# Patient Record
Sex: Male | Born: 1990 | Race: White | Hispanic: Yes | Marital: Single | Smoking: Former smoker
Health system: Southern US, Community
[De-identification: ages and names within clinical notes are randomized; demographics above are authoritative.]

## PROBLEM LIST (undated history)

## (undated) DIAGNOSIS — N451 Epididymitis: Secondary | ICD-10-CM

## (undated) DIAGNOSIS — F3181 Bipolar II disorder: Secondary | ICD-10-CM

## (undated) DIAGNOSIS — J45909 Unspecified asthma, uncomplicated: Secondary | ICD-10-CM

---
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
## 2013-10-30 DIAGNOSIS — N451 Epididymitis: Secondary | ICD-10-CM

## 2013-10-30 HISTORY — DX: Epididymitis: N45.1

## 2014-01-09 ENCOUNTER — Inpatient Hospital Stay (HOSPITAL_COMMUNITY)
Admission: EM | Admit: 2014-01-09 | Discharge: 2014-01-11 | DRG: 689 | Disposition: A | Discharge status: Home / Self Care | Payer: 59 | Attending: Internal Medicine | Admitting: Internal Medicine

## 2014-01-09 ENCOUNTER — Emergency Department (HOSPITAL_COMMUNITY): Payer: 59

## 2014-01-09 ENCOUNTER — Encounter (HOSPITAL_COMMUNITY): Payer: Self-pay | Admitting: Emergency Medicine

## 2014-01-09 DIAGNOSIS — N390 Urinary tract infection, site not specified: Principal | ICD-10-CM

## 2014-01-09 DIAGNOSIS — D72829 Elevated white blood cell count, unspecified: Secondary | ICD-10-CM | POA: Diagnosis present

## 2014-01-09 DIAGNOSIS — J45909 Unspecified asthma, uncomplicated: Secondary | ICD-10-CM | POA: Diagnosis present

## 2014-01-09 DIAGNOSIS — N451 Epididymitis: Secondary | ICD-10-CM | POA: Diagnosis present

## 2014-01-09 DIAGNOSIS — J40 Bronchitis, not specified as acute or chronic: Secondary | ICD-10-CM

## 2014-01-09 DIAGNOSIS — R059 Cough, unspecified: Secondary | ICD-10-CM | POA: Diagnosis present

## 2014-01-09 DIAGNOSIS — F329 Major depressive disorder, single episode, unspecified: Secondary | ICD-10-CM | POA: Diagnosis present

## 2014-01-09 DIAGNOSIS — F3289 Other specified depressive episodes: Secondary | ICD-10-CM | POA: Diagnosis present

## 2014-01-09 DIAGNOSIS — R05 Cough: Secondary | ICD-10-CM | POA: Diagnosis present

## 2014-01-09 DIAGNOSIS — F1210 Cannabis abuse, uncomplicated: Secondary | ICD-10-CM | POA: Diagnosis present

## 2014-01-09 DIAGNOSIS — R109 Unspecified abdominal pain: Secondary | ICD-10-CM

## 2014-01-09 DIAGNOSIS — Z79899 Other long term (current) drug therapy: Secondary | ICD-10-CM

## 2014-01-09 DIAGNOSIS — F411 Generalized anxiety disorder: Secondary | ICD-10-CM | POA: Diagnosis present

## 2014-01-09 DIAGNOSIS — F17200 Nicotine dependence, unspecified, uncomplicated: Secondary | ICD-10-CM | POA: Diagnosis present

## 2014-01-09 DIAGNOSIS — A419 Sepsis, unspecified organism: Secondary | ICD-10-CM

## 2014-01-09 HISTORY — DX: Epididymitis: N45.1

## 2014-01-09 HISTORY — DX: Unspecified asthma, uncomplicated: J45.909

## 2014-01-09 LAB — CBC WITH DIFFERENTIAL/PLATELET
Basophils Absolute: 0 10*3/uL (ref 0.0–0.1)
Basophils Relative: 0 % (ref 0–1)
EOS ABS: 0 10*3/uL (ref 0.0–0.7)
EOS PCT: 0 % (ref 0–5)
HCT: 42 % (ref 39.0–52.0)
Hemoglobin: 14.9 g/dL (ref 13.0–17.0)
Lymphocytes Relative: 9 % — ABNORMAL LOW (ref 12–46)
Lymphs Abs: 1.8 10*3/uL (ref 0.7–4.0)
MCH: 29.9 pg (ref 26.0–34.0)
MCHC: 35.5 g/dL (ref 30.0–36.0)
MCV: 84.2 fL (ref 78.0–100.0)
MONO ABS: 2.3 10*3/uL — AB (ref 0.1–1.0)
Monocytes Relative: 11 % (ref 3–12)
NEUTROS ABS: 16.6 10*3/uL — AB (ref 1.7–7.7)
NEUTROS PCT: 80 % — AB (ref 43–77)
PLATELETS: 266 10*3/uL (ref 150–400)
RBC: 4.99 MIL/uL (ref 4.22–5.81)
RDW: 13.4 % (ref 11.5–15.5)
WBC: 20.8 10*3/uL — AB (ref 4.0–10.5)

## 2014-01-09 LAB — BASIC METABOLIC PANEL
BUN: 7 mg/dL (ref 6–23)
CO2: 24 meq/L (ref 19–32)
Calcium: 9.5 mg/dL (ref 8.4–10.5)
Chloride: 98 mEq/L (ref 96–112)
Creatinine, Ser: 0.72 mg/dL (ref 0.50–1.35)
GFR calc Af Amer: >90 mL/min (ref >90)
GLUCOSE: 92 mg/dL (ref 70–99)
Potassium: 4 mEq/L (ref 3.7–5.3)
Sodium: 137 mEq/L (ref 137–147)

## 2014-01-09 LAB — URINALYSIS, ROUTINE W REFLEX MICROSCOPIC
BILIRUBIN URINE: NEGATIVE
Glucose, UA: NEGATIVE mg/dL
HGB URINE DIPSTICK: NEGATIVE
KETONES UR: NEGATIVE mg/dL
NITRITE: POSITIVE — AB
Protein, ur: NEGATIVE mg/dL
Specific Gravity, Urine: 1.011 (ref 1.005–1.030)
Urobilinogen, UA: 0.2 mg/dL (ref 0.0–1.0)
pH: 8.5 — ABNORMAL HIGH (ref 5.0–8.0)

## 2014-01-09 LAB — URINE MICROSCOPIC-ADD ON

## 2014-01-09 LAB — CG4 I-STAT (LACTIC ACID): Lactic Acid, Venous: 0.69 mmol/L (ref 0.5–2.2)

## 2014-01-09 MED ORDER — ONDANSETRON HCL 4 MG/2ML IJ SOLN
4.0000 mg | Freq: Once | INTRAMUSCULAR | Status: AC
  Administered 2014-01-09: 4 mg via INTRAVENOUS
  Filled 2014-01-09: qty 2

## 2014-01-09 MED ORDER — IBUPROFEN 600 MG PO TABS
600.0000 mg | ORAL_TABLET | Freq: Four times a day (QID) | ORAL | Status: DC | PRN
  Filled 2014-01-09: qty 1

## 2014-01-09 MED ORDER — ACETAMINOPHEN 650 MG RE SUPP: 650.0000 mg | Freq: Four times a day (QID) | RECTAL | Status: DC | PRN

## 2014-01-09 MED ORDER — GUAIFENESIN 100 MG/5ML PO SOLN: 10.0000 mL | ORAL | Status: DC | PRN

## 2014-01-09 MED ORDER — ONDANSETRON HCL 4 MG PO TABS: 4.0000 mg | ORAL_TABLET | Freq: Four times a day (QID) | ORAL | Status: DC | PRN

## 2014-01-09 MED ORDER — SODIUM CHLORIDE 0.9 % IJ SOLN
3.0000 mL | Freq: Two times a day (BID) | INTRAMUSCULAR | Status: DC
  Administered 2014-01-11: 3 mL via INTRAVENOUS

## 2014-01-09 MED ORDER — ONDANSETRON HCL 4 MG/2ML IJ SOLN
4.0000 mg | Freq: Four times a day (QID) | INTRAMUSCULAR | Status: DC | PRN
  Administered 2014-01-11: 4 mg via INTRAVENOUS
  Filled 2014-01-09: qty 2

## 2014-01-09 MED ORDER — HYDROMORPHONE HCL PF 1 MG/ML IJ SOLN: 1.0000 mg | INTRAMUSCULAR | Status: AC | PRN

## 2014-01-09 MED ORDER — SODIUM CHLORIDE 0.9 % IV SOLN: INTRAVENOUS | Status: DC

## 2014-01-09 MED ORDER — ENOXAPARIN SODIUM 40 MG/0.4ML ~~LOC~~ SOLN
40.0000 mg | Freq: Every day | SUBCUTANEOUS | Status: DC
  Administered 2014-01-09 – 2014-01-10 (×2): 40 mg via SUBCUTANEOUS
  Filled 2014-01-09 (×3): qty 0.4

## 2014-01-09 MED ORDER — SODIUM CHLORIDE 0.9 % IV BOLUS (SEPSIS)
1000.0000 mL | Freq: Once | INTRAVENOUS | Status: AC
  Administered 2014-01-09: 1000 mL via INTRAVENOUS

## 2014-01-09 MED ORDER — ACETAMINOPHEN 325 MG PO TABS
650.0000 mg | ORAL_TABLET | Freq: Four times a day (QID) | ORAL | Status: DC | PRN
  Administered 2014-01-11: 650 mg via ORAL
  Filled 2014-01-09: qty 2

## 2014-01-09 MED ORDER — DEXTROSE 5 % IV SOLN
500.0000 mg | Freq: Every day | INTRAVENOUS | Status: DC
  Administered 2014-01-09 – 2014-01-10 (×2): 500 mg via INTRAVENOUS
  Filled 2014-01-09 (×3): qty 500

## 2014-01-09 MED ORDER — IOHEXOL 300 MG/ML  SOLN
100.0000 mL | Freq: Once | INTRAMUSCULAR | Status: AC | PRN
  Administered 2014-01-09: 100 mL via INTRAVENOUS

## 2014-01-09 MED ORDER — DEXTROSE 5 % IV SOLN
1.0000 g | Freq: Once | INTRAVENOUS | Status: AC
  Administered 2014-01-09: 1 g via INTRAVENOUS
  Filled 2014-01-09: qty 10

## 2014-01-09 MED ORDER — ONDANSETRON HCL 4 MG/2ML IJ SOLN: 4.0000 mg | Freq: Three times a day (TID) | INTRAMUSCULAR | Status: AC | PRN

## 2014-01-09 MED ORDER — DEXTROSE 5 % IV SOLN
1.0000 g | INTRAVENOUS | Status: DC
  Administered 2014-01-10: 1 g via INTRAVENOUS
  Filled 2014-01-09 (×2): qty 10

## 2014-01-09 MED ORDER — SODIUM CHLORIDE 0.9 % IV SOLN
INTRAVENOUS | Status: DC
  Administered 2014-01-09: via INTRAVENOUS

## 2014-01-09 MED ORDER — KETOROLAC TROMETHAMINE 30 MG/ML IJ SOLN
30.0000 mg | Freq: Once | INTRAMUSCULAR | Status: AC
  Administered 2014-01-09: 30 mg via INTRAVENOUS
  Filled 2014-01-09: qty 1

## 2014-01-09 NOTE — ED Notes (Signed)
Pt alert and oriented x4. Respirations even and unlabored, bilateral symmetrical rise and fall of chest. Skin warm and dry. In no acute distress. Denies needs.   

## 2014-01-09 NOTE — ED Notes (Addendum)
Pt c/o increasing cold symptoms and rib cage pain x "over one week."  Pain score 5/10.  Pt sts "I had a really crappy night, last night, I kept coughing and I was SOB."  Hx of childhood asthma and anxiety.

## 2014-01-09 NOTE — Progress Notes (Signed)
   CARE MANAGEMENT ED NOTE 01/09/2014  Patient:  Moreen FowlerKAPLAN,Lelynd T   Account Number:  [id number]  Date Initiated:  01/09/2014  Documentation initiated by:  Edd ArbourGIBBS,Dimples Probus  Subjective/Objective Assessment:   23 yr old [employer] male [facility]     Subjective/Objective Assessment Detail:     Action/Plan:   Cm spoke with pt updated EPIC   Action/Plan Detail:   Anticipated DC Date:  01/09/2014     Status Recommendation to Physician:   Result of Recommendation:    Other ED Services  Consult Working Plan    DC Planning Services  Other  PCP issues    Choice offered to / List presented to:            Status of service:  Completed, signed off  ED Comments:   ED Comments Detail:

## 2014-01-09 NOTE — ED Provider Notes (Signed)
TIME SEEN: 5:12 PM  CHIEF COMPLAINT: Cough, body aches, fever  HPI: Patient is a 23 year old male with a history of asthma who presents emergency department with one week of fever, chills, myalgias, productive cough. He reports he is currently in school and has had multiple sick contacts. No recent international travel. No headache, neck pain or neck stiffness. He has not had much of an appetite. No nausea, vomiting or diarrhea. Reports he last took Tylenol at 4 AM this morning. No rash. Patient denies a history of prior pulmonary embolus, DVT, recent prolonged immobilization such as hospitalization or long flight, surgery, trauma, fracture, lower extremity swelling or pain.  ROS: See HPI Constitutional: fever  Eyes: no drainage  ENT: no runny nose   Cardiovascular:  Chest soreness due to coughing Resp: no SOB  GI: no vomiting GU: no dysuria Integumentary: no rash  Allergy: no hives  Musculoskeletal: no leg swelling  Neurological: no slurred speech ROS otherwise negative  PAST MEDICAL HISTORY/PAST SURGICAL HISTORY:  Past Medical History  Diagnosis Date  . Asthma     MEDICATIONS:  Prior to Admission medications   Medication Sig Start Date End Date Taking? Authorizing Provider  acetaminophen (TYLENOL) 500 MG tablet Take 500 mg by mouth every 6 (six) hours as needed for moderate pain or headache.   Yes Historical Provider, MD  guaiFENesin (ROBITUSSIN) 100 MG/5ML SOLN Take 10 mLs by mouth every 4 (four) hours as needed for cough or to loosen phlegm.   Yes Historical Provider, MD  Multiple Vitamin (MULTIVITAMIN WITH MINERALS) TABS tablet Take 1 tablet by mouth daily.   Yes Historical Provider, MD  Phenylephrine-APAP-Guaifenesin [facility](MUCINEX SINUS-MAX) 10-650-400 MG/20ML LIQD Take 20 mLs by mouth every 4 (four) hours as needed (cough).   Yes Historical Provider, MD    ALLERGIES:  Allergies  Allergen Reactions  . Onion Hives and Shortness Of Breath  . Chocolate Hives  . Peach [Prunus  Persica] Hives  . Strawberry Hives    SOCIAL HISTORY:  History  Substance Use Topics  . Smoking status: Former Smoker    Quit date: 11/30/2013  . Smokeless tobacco: Not on file  . Alcohol Use: Yes    FAMILY HISTORY: History reviewed. No pertinent family history.  EXAM: BP 113/52  Pulse 71  Temp(Src) 99.5 F (37.5 C) (Oral)  Resp 17  SpO2 100% CONSTITUTIONAL: Alert and oriented and responds appropriately to questions. Well-appearing; well-nourished, no apparent distress, nontoxic HEAD: Normocephalic EYES: Conjunctivae clear, PERRL ENT: normal nose; no rhinorrhea; moist mucous membranes; pharynx without lesions noted NECK: Supple, no meningismus, no LAD  CARD: Tachycardic in the 120s; S1 and S2 appreciated; no murmurs, no clicks, no rubs, no gallops RESP: Normal chest excursion without splinting or tachypnea; breath sounds clear and equal bilaterally; no wheezes, no rhonchi, no rales,  ABD/GI: Normal bowel sounds; non-distended; soft, non-tender, no rebound, no guarding BACK:  The back appears normal and is non-tender to palpation, there is no CVA tenderness EXT: Normal ROM in all joints; non-tender to palpation; no edema; normal capillary refill; no cyanosis    SKIN: Normal color for age and race; warm NEURO: Moves all extremities equally PSYCH: The patient's mood and manner are appropriate. Grooming and personal hygiene are appropriate.  MEDICAL DECISION MAKING: Patient here with likely viral illness vs PNA. He is hemodynamically stable but mildly tachycardic but afebrile. Will obtain chest x-ray, give IV fluids and Toradol and reassess. No concern for meningitis.   ED PROGRESS: Patient has a leukocytosis of 20.8 with  left shift. His urine shows nitrite positive urinary tract infection. Chest x-ray clear. Urine and blood cultures pending. Patient is complaining of left-sided flank pain. We'll obtain CT imaging to rule out pyelonephritis. We'll give IV ceftriaxone. He reports  that he is homosexual and has had prior urinary tract infections before. He uses protection intermittently. He states he's not concerned that he may have a sexually transmitted diseases he has never had one before and he is in a monogamous relationship. He reports a last time he was on antibiotics for several months ago. He has had nausea but no vomiting. He reports feeling better after Toradol and fluids is still tachycardic. Will reassess after CT scan second liter of IV fluids.   9:17 PM  CT scan  Shows no signs of pyelonephritis or kidney stone. He is still mildly tachycardic but this is improving. Given his tachycardia, significant leukocytosis and source of infection, patient needs sepsis criteria. Have discussed with patient and family he reports he would feel more comfortable with being admitted to the hospital. Will discuss with hospitalist for admission. His PCP is with [facility] physicians.    EKG Interpretation    Date/Time:  Tuesday January 09 2014 16:56:37 EST Ventricular Rate:  100 PR Interval:  152 QRS Duration: 96 QT Interval:  335 QTC Calculation: 432 R Axis:   90 Text Interpretation:  Sinus tachycardia Consider right atrial enlargement Borderline right axis deviation ST elev, probable normal early repol pattern No old tracing to compare Confirmed by Jalynn Betzold  DO, [place] Nie ([id number]) on 01/09/2014 5:03:43 PM               Layla Maw Suda Forbess, DO 01/09/14 2241

## 2014-01-09 NOTE — ED Notes (Signed)
POCT CG4 given to Dr. Elesa MassedWard

## 2014-01-09 NOTE — Progress Notes (Signed)
ANTIBIOTIC CONSULT NOTE - INITIAL  Pharmacy Consult for ceftriaxone Indication: UTI  Allergies  Allergen Reactions  . Onion Hives and Shortness Of Breath  . Chocolate Hives  . Peach [Prunus Persica] Hives  . Strawberry Hives    Patient Measurements: Height: 6' (182.9 cm) Weight: 196 lb 10.4 oz (89.2 kg) IBW/kg (Calculated) : 77.6 Adjusted Body Weight:   Vital Signs: Temp: 99 F (37.2 C) (02/10 2300) Temp src: Oral (02/10 2300) BP: 124/67 mmHg (02/10 2300) Pulse Rate: 100 (02/10 2300) Intake/Output from previous day:   Intake/Output from this shift:    Labs:  Recent Labs  01/09/14 1830  WBC 20.8*  HGB 14.9  PLT 266  CREATININE 0.72   Estimated Creatinine Clearance: 160.3 ml/min (by C-G formula based on Cr of 0.72). No results found for this basename: VANCOTROUGH, VANCOPEAK, VANCORANDOM, GENTTROUGH, GENTPEAK, GENTRANDOM, TOBRATROUGH, TOBRAPEAK, TOBRARND, AMIKACINPEAK, AMIKACINTROU, AMIKACIN,  in the last 72 hours   Microbiology: No results found for this or any previous visit (from the past 720 hour(s)).  Medical History: Past Medical History  Diagnosis Date  . Asthma   . Epididymitis, bilateral 10/30/2013    Medications:  Anti-infectives   Start     Dose/Rate Route Frequency Ordered Stop   01/10/14 2200  cefTRIAXone (ROCEPHIN) 1 g in dextrose 5 % 50 mL IVPB     1 g 100 mL/hr over 30 Minutes Intravenous Every 24 hours 01/09/14 2321     01/09/14 2330  azithromycin (ZITHROMAX) 500 mg in dextrose 5 % 250 mL IVPB     500 mg 250 mL/hr over 60 Minutes Intravenous Daily at bedtime 01/09/14 2318     01/09/14 2000  cefTRIAXone (ROCEPHIN) 1 g in dextrose 5 % 50 mL IVPB     1 g 100 mL/hr over 30 Minutes Intravenous  Once 01/09/14 1950 01/09/14 2224     Assessment: Patient with UTI.  First dose of antibiotics already given.  Goal of Therapy:  Rocephin based on manufacturer dosing recommendations.   Plan:  Ceftriaxone 1gm iv q24hr  Aleene DavidsonGrimsley Jr, Laurina Fischl  [place] 01/09/2014,11:22 PM

## 2014-01-09 NOTE — H&P (Signed)
[facility] History and Physical  Patient: Joe Long  ZOX:[id number]  DOB: 04/10/1991  DOS: the patient was seen and examined on 01/09/2014 PCP: Provider Not In System  Chief Complaint: Nausea vomiting and right flank pain  HPI: IBROHIM SIMMERS is a 23 y.o. male with Past medical history of asthma and bilateral epididymidis. The patient is coming from home. The patient presents with complaints of one week of fever with chills myalgia and productive cough. He has yellowish sputum expectoration. He denies any recent travel. Pt denies any chest pain, palpitation, orthopnea, PND, active bleeding,pedal edema,  focal neurological deficit.  Shortness of breath earlier this morning along with that he had some dizziness throughout the day. Since last to this she started having episodes of burning urination without any blood. He had nausea but denied vomiting. He also noticed of diarrhea or loose watery bowel movement. Some over-the-counter Tylenol and Mucinex but that did not help him. He had unprotected sexual intercourse 2-3 days ago prior to this symptom onset. He has history of epididymidis in the past but currently the symptoms are not similar to that. He has on and off testicular pain on right side which lasts for seconds and then resolve but most of her symptoms in the last few days. He denies any decreased urination or difficulty urinating. He smokes on and off, use marijuana on and off, alcohol without any abuse. He is monogamous in his relationship and has been checked for STD 4 months ago and it was negative.  Review of Systems: as mentioned in the history of present illness.  A Comprehensive review of the other systems is negative.  Past Medical History  Diagnosis Date  . Asthma   . Epididymitis, bilateral 10/30/2013   Past Surgical History  Procedure Laterality Date  . Tonsillectomy  2001   Social History:  reports that he quit smoking about 5 weeks ago. His smoking use  included Cigarettes. He has a 1 pack-year smoking history. He has never used smokeless tobacco. He reports that he drinks about 1.8 ounces of alcohol per week. He reports that he uses illicit drugs (Marijuana) about twice per week. Independent for most of his  ADL.  Allergies  Allergen Reactions  . Onion Hives and Shortness Of Breath  . Chocolate Hives  . Peach [Prunus Persica] Hives  . Strawberry Hives    History reviewed. No pertinent family history.  Prior to Admission medications   Medication Sig Start Date End Date Taking? Authorizing Provider  acetaminophen (TYLENOL) 500 MG tablet Take 500 mg by mouth every 6 (six) hours as needed for moderate pain or headache.   Yes Historical Provider, MD  guaiFENesin (ROBITUSSIN) 100 MG/5ML SOLN Take 10 mLs by mouth every 4 (four) hours as needed for cough or to loosen phlegm.   Yes Historical Provider, MD  Multiple Vitamin (MULTIVITAMIN WITH MINERALS) TABS tablet Take 1 tablet by mouth daily.   Yes Historical Provider, MD  Phenylephrine-APAP-Guaifenesin [facility] SINUS-MAX) 10-650-400 MG/20ML LIQD Take 20 mLs by mouth every 4 (four) hours as needed (cough).   Yes Historical Provider, MD    Physical Exam: Filed Vitals:   01/09/14 1658 01/09/14 1840 01/09/14 2300  BP: 113/52  124/67  Pulse: 71 89 100  Temp: 99.5 F (37.5 C)  99 F (37.2 C)  TempSrc: Oral  Oral  Resp: 17 22 20   Height:   6' (1.829 m)  Weight:   89.2 kg (196 lb 10.4 oz)  SpO2: 100% 99% 100%  General: Alert, Awake and Oriented to Time, Place and Person. Appear in moderate distress Eyes: PERRL ENT: Oral Mucosa shows ERYTHEMA. Neck: No JVD Cardiovascular: S1 and S2 Present, no Murmur, Peripheral Pulses Present Respiratory: Bilateral Air entry equal and Decreased, Clear to Auscultation,  No Crackles, no wheezes Abdomen: Bowel Sound Present, Soft and diffuse minimal tender right lower quadrant and flank. Skin: No Rash Extremities: No Pedal edema, no calf  tenderness Neurologic: Grossly Unremarkable. Labs on Admission:  CBC:  Recent Labs Lab 01/09/14 1830  WBC 20.8*  NEUTROABS 16.6*  HGB 14.9  HCT 42.0  MCV 84.2  PLT 266    CMP     Component Value Date/Time   NA 137 01/09/2014 1830   K 4.0 01/09/2014 1830   CL 98 01/09/2014 1830   CO2 24 01/09/2014 1830   GLUCOSE 92 01/09/2014 1830   BUN 7 01/09/2014 1830   CREATININE 0.72 01/09/2014 1830   CALCIUM 9.5 01/09/2014 1830   GFRNONAA >90 01/09/2014 1830   GFRAA >90 01/09/2014 1830    No results found for this basename: LIPASE, AMYLASE,  in the last 168 hours No results found for this basename: AMMONIA,  in the last 168 hours  No results found for this basename: CKTOTAL, CKMB, CKMBINDEX, TROPONINI,  in the last 168 hours BNP (last 3 results) No results found for this basename: PROBNP,  in the last 8760 hours  Radiological Exams on Admission: Dg Chest 2 View  01/09/2014   CLINICAL DATA:  Cough and chest congestion. Shortness of breath and fever. Ex-smoker as of 1 month ago.  EXAM: CHEST  2 VIEW  COMPARISON:  None.  FINDINGS: Midline trachea. Normal heart size and mediastinal contours. No pleural effusion or pneumothorax. Mild interstitial thickening. No lobar consolidation.  IMPRESSION: No acute cardiopulmonary disease.  Mild interstitial thickening, likely related to chronic bronchitis/smoking.   Electronically Signed   By: Jeronimo Greaves M.D.   On: 01/09/2014 18:18   Ct Abdomen Pelvis W Contrast  01/09/2014   CLINICAL DATA:  Left flank pain  EXAM: CT ABDOMEN AND PELVIS WITH CONTRAST  TECHNIQUE: Multidetector CT imaging of the abdomen and pelvis was performed using the standard protocol following bolus administration of intravenous contrast.  CONTRAST:  100 mL Omnipaque 300  COMPARISON:  None.  FINDINGS: The liver, spleen, pancreas, gallbladder, adrenal glands and kidneys are normal. There is no CT evidence of left pyelonephritis. The aorta is normal. There is no abdominal lymphadenopathy.  There is no small bowel obstruction or diverticulitis. There is no evidence of appendicitis.  Fluid-filled bladder is normal. The lung bases are clear. No acute abnormalities identified within the visualized bones.  IMPRESSION: No acute abnormality identified in the abdomen and pelvis. No CT evidence of pyelonephritis.   Electronically Signed   By: Sherian Rein M.D.   On: 01/09/2014 21:05     Assessment/Plan Principal Problem:   UTI (lower urinary tract infection) Active Problems:   h/o Epididymitis   Cough   Bronchitis   Right flank pain   1. UTI (lower urinary tract infection) Patient is complains of flank pain, fever, nausea, leukocytosis, tachycardia, and hypotension. Thus he has criteria for sepsis due to UTI. At present I would admit him to the hospital treat him with IV ceftriaxone and IV azithromycin to cover for STDs. Will check gonococcal and chlamydia. I would also hydrate him with IV fluids. Follow urine culture and blood culture He does not have evidence of epididymitis or prostatitis at present continue to  monitor.  2. Cough and bronchitis Continue ceftriaxone and azithromycin Mucinex and Tessalon Perles  DVT Prophylaxis: subcutaneous Heparin Nutrition: Advance as tolerated  Code Status: Full  Disposition: Admitted to observation in telemetry unit.  Author: Lynden OxfordPranav Patel, MD [facility] Pager: [phone] 01/09/2014, 11:18 PM    If 7PM-7AM, please contact night-coverage [email or web address] Password [email or web address]

## 2014-01-10 LAB — CBC
HCT: 41.7 % (ref 39.0–52.0)
Hemoglobin: 13.8 g/dL (ref 13.0–17.0)
MCH: 28.3 pg (ref 26.0–34.0)
MCHC: 33.1 g/dL (ref 30.0–36.0)
MCV: 85.5 fL (ref 78.0–100.0)
Platelets: 202 10*3/uL (ref 150–400)
RBC: 4.88 MIL/uL (ref 4.22–5.81)
RDW: 13.5 % (ref 11.5–15.5)
WBC: 19.5 10*3/uL — ABNORMAL HIGH (ref 4.0–10.5)

## 2014-01-10 LAB — INFLUENZA PANEL BY PCR (TYPE A & B)
H1N1 flu by pcr: NOT DETECTED
Influenza A By PCR: NEGATIVE
Influenza B By PCR: NEGATIVE

## 2014-01-10 LAB — COMPREHENSIVE METABOLIC PANEL
ALBUMIN: 3.5 g/dL (ref 3.5–5.2)
ALT: 16 U/L (ref 0–53)
AST: 13 U/L (ref 0–37)
Alkaline Phosphatase: 53 U/L (ref 39–117)
BUN: 8 mg/dL (ref 6–23)
CO2: 24 meq/L (ref 19–32)
CREATININE: 0.81 mg/dL (ref 0.50–1.35)
Calcium: 8.6 mg/dL (ref 8.4–10.5)
Chloride: 102 mEq/L (ref 96–112)
GFR calc Af Amer: >90 mL/min (ref >90)
GFR calc non Af Amer: >90 mL/min (ref >90)
Glucose, Bld: 108 mg/dL — ABNORMAL HIGH (ref 70–99)
POTASSIUM: 3.8 meq/L (ref 3.7–5.3)
Sodium: 137 mEq/L (ref 137–147)
Total Bilirubin: 0.6 mg/dL (ref 0.3–1.2)
Total Protein: 6.3 g/dL (ref 6.0–8.3)

## 2014-01-10 LAB — GC/CHLAMYDIA PROBE AMP
CT Probe RNA: NEGATIVE
GC PROBE AMP APTIMA: NEGATIVE

## 2014-01-10 LAB — PROTIME-INR
INR: 1.23 (ref 0.00–1.49)
Prothrombin Time: 15.2 seconds (ref 11.6–15.2)

## 2014-01-10 LAB — HIV ANTIBODY (ROUTINE TESTING W REFLEX): HIV: NONREACTIVE

## 2014-01-10 MED ORDER — SERTRALINE HCL 50 MG PO TABS
50.0000 mg | ORAL_TABLET | Freq: Every day | ORAL | Status: DC
  Administered 2014-01-11: 50 mg via ORAL
  Filled 2014-01-10: qty 1

## 2014-01-10 NOTE — Progress Notes (Signed)
PROGRESS NOTE  Joe Long RUE:[id number] DOB: [birth year] DOA: 01/09/2014 PCP: Provider Not In System  Assessment/Plan: UTI - cultures pending.  Cough/bronchitis - Ceftriaxone/Azithro.  Anxiety - start Zoloft  Diet: regular Fluids: none DVT Prophylaxis: heparin  Code Status: Full Family Communication: mother bedside  Disposition Plan: inpatiet  Consultants:  none  Procedures:  none   Antibiotics  Anti-infectives   Start     Dose/Rate Route Frequency Ordered Stop   01/10/14 2200  cefTRIAXone (ROCEPHIN) 1 g in dextrose 5 % 50 mL IVPB     1 g 100 mL/hr over 30 Minutes Intravenous Every 24 hours 01/09/14 2321     01/09/14 2330  azithromycin (ZITHROMAX) 500 mg in dextrose 5 % 250 mL IVPB     500 mg 250 mL/hr over 60 Minutes Intravenous Daily at bedtime 01/09/14 2318     01/09/14 2000  cefTRIAXone (ROCEPHIN) 1 g in dextrose 5 % 50 mL IVPB     1 g 100 mL/hr over 30 Minutes Intravenous  Once 01/09/14 1950 01/09/14 2224     Antibiotics Given (last 72 hours)   Date/Time Action Medication Dose Rate   01/09/14 2348 Given   azithromycin (ZITHROMAX) 500 mg in dextrose 5 % 250 mL IVPB 500 mg 250 mL/hr      HPI/Subjective: Feeling better  Objective: Filed Vitals:   01/09/14 2300 01/10/14 0419 01/10/14 0500 01/10/14 1416  BP: 124/67 106/58 102/56 121/65  Pulse: 100 89  78  Temp: 99 F (37.2 C) 100.4 F (38 C) 100 F (37.8 C) 97.9 F (36.6 C)  TempSrc: Oral Oral Oral Oral  Resp: 20 20  18   Height: 6' (1.829 m)     Weight: 89.2 kg (196 lb 10.4 oz) 88.5 kg (195 lb 1.7 oz)    SpO2: 100% 100%  100%    Intake/Output Summary (Last 24 hours) at 01/10/14 1733 Last data filed at 01/10/14 1425  Gross per 24 hour  Intake   1365 ml  Output    900 ml  Net    465 ml   Filed Weights   01/09/14 2300 01/10/14 0419  Weight: 89.2 kg (196 lb 10.4 oz) 88.5 kg (195 lb 1.7 oz)    Exam:   General:  NAD  Cardiovascular: regular rate and rhythm, without  MRG  Respiratory: good air movement, clear to auscultation throughout, no wheezing, ronchi or rales  Abdomen: soft, not tender to palpation, positive bowel sounds  MSK: no peripheral edema  Neuro: CN 2-12 grossly intact, MS 5/5 in all 4  Data Reviewed: Basic Metabolic Panel:  Recent Labs Lab 01/09/14 1830 01/10/14 0355  NA 137 137  K 4.0 3.8  CL 98 102  CO2 24 24  GLUCOSE 92 108*  BUN 7 8  CREATININE 0.72 0.81  CALCIUM 9.5 8.6   Liver Function Tests:  Recent Labs Lab 01/10/14 0355  AST 13  ALT 16  ALKPHOS 53  BILITOT 0.6  PROT 6.3  ALBUMIN 3.5   CBC:  Recent Labs Lab 01/09/14 1830 01/10/14 0355  WBC 20.8* 19.5*  NEUTROABS 16.6*  --   HGB 14.9 13.8  HCT 42.0 41.7  MCV 84.2 85.5  PLT 266 202   Cardiac Enzymes: No results found for this basename: CKTOTAL, CKMB, CKMBINDEX, TROPONINI,  in the last 168 hours BNP (last 3 results) No results found for this basename: PROBNP,  in the last 8760 hours CBG: No results found for this basename: GLUCAP,  in the last 168 hours  Recent Results (from the past 240 hour(s))  GC/CHLAMYDIA PROBE AMP     Status: None   Collection Time    01/10/14 12:06 AM      Result Value Ref Range Status   CT Probe RNA NEGATIVE  NEGATIVE Final   GC Probe RNA NEGATIVE  NEGATIVE Final   Comment: (NOTE)                                                                                               **Normal Reference Range: Negative**          Assay performed using the Gen-Probe APTIMA COMBO2 (R) Assay.     Acceptable specimen types for this assay include APTIMA Swabs (Unisex,     endocervical, urethral, or vaginal), first void urine, and ThinPrep     liquid based cytology samples.     Performed at [facility]     Studies: Dg Chest 2 View  01/09/2014   CLINICAL DATA:  Cough and chest congestion. Shortness of breath and fever. Ex-smoker as of 1 month ago.  EXAM: CHEST  2 VIEW  COMPARISON:  None.  FINDINGS: Midline trachea.  Normal heart size and mediastinal contours. No pleural effusion or pneumothorax. Mild interstitial thickening. No lobar consolidation.  IMPRESSION: No acute cardiopulmonary disease.  Mild interstitial thickening, likely related to chronic bronchitis/smoking.   Electronically Signed   By: Jeronimo GreavesKyle  Talbot M.D.   On: 01/09/2014 18:18   Ct Abdomen Pelvis W Contrast  01/09/2014   CLINICAL DATA:  Left flank pain  EXAM: CT ABDOMEN AND PELVIS WITH CONTRAST  TECHNIQUE: Multidetector CT imaging of the abdomen and pelvis was performed using the standard protocol following bolus administration of intravenous contrast.  CONTRAST:  100 mL Omnipaque 300  COMPARISON:  None.  FINDINGS: The liver, spleen, pancreas, gallbladder, adrenal glands and kidneys are normal. There is no CT evidence of left pyelonephritis. The aorta is normal. There is no abdominal lymphadenopathy. There is no small bowel obstruction or diverticulitis. There is no evidence of appendicitis.  Fluid-filled bladder is normal. The lung bases are clear. No acute abnormalities identified within the visualized bones.  IMPRESSION: No acute abnormality identified in the abdomen and pelvis. No CT evidence of pyelonephritis.   Electronically Signed   By: Sherian ReinWei-Chen  Lin M.D.   On: 01/09/2014 21:05    Scheduled Meds: . azithromycin  500 mg Intravenous QHS  . cefTRIAXone (ROCEPHIN)  IV  1 g Intravenous Q24H  . enoxaparin (LOVENOX) injection  40 mg Subcutaneous QHS  . sodium chloride  3 mL Intravenous Q12H   Continuous Infusions:   Principal Problem:   UTI (lower urinary tract infection) Active Problems:   h/o Epididymitis   Cough   Bronchitis   Right flank pain   Time spent: 25  Pamella Pertostin Adrean Heitz, MD [facility] Pager [phone]. If 7 PM - 7 AM, please contact night-coverage at [email or web address], password [email or web address] 01/10/2014, 5:33 PM  LOS: 1 day

## 2014-01-10 NOTE — Progress Notes (Signed)
UR completed. Patient changed to inpatient- requiring IV antibiotics and IVF @ 125cc/hr  

## 2014-01-11 LAB — CBC
HCT: 40.6 % (ref 39.0–52.0)
Hemoglobin: 13.5 g/dL (ref 13.0–17.0)
MCH: 28.4 pg (ref 26.0–34.0)
MCHC: 33.3 g/dL (ref 30.0–36.0)
MCV: 85.5 fL (ref 78.0–100.0)
Platelets: 211 10*3/uL (ref 150–400)
RBC: 4.75 MIL/uL (ref 4.22–5.81)
RDW: 13.6 % (ref 11.5–15.5)
WBC: 12.6 10*3/uL — AB (ref 4.0–10.5)

## 2014-01-11 LAB — URINE CULTURE: Colony Count: >=100000

## 2014-01-11 MED ORDER — PROMETHAZINE HCL 25 MG/ML IJ SOLN
12.5000 mg | Freq: Once | INTRAMUSCULAR | Status: AC
  Administered 2014-01-11: 12.5 mg via INTRAVENOUS
  Filled 2014-01-11: qty 1

## 2014-01-11 MED ORDER — CIPROFLOXACIN HCL 500 MG PO TABS: 500.0000 mg | ORAL_TABLET | Freq: Two times a day (BID) | ORAL | Status: DC

## 2014-01-11 MED ORDER — SERTRALINE HCL 50 MG PO TABS: 50.0000 mg | ORAL_TABLET | Freq: Every day | ORAL | Status: DC

## 2014-01-11 NOTE — Progress Notes (Signed)
Clinical Social Work Department BRIEF PSYCHOSOCIAL ASSESSMENT 01/11/2014  Patient:  Joe FowlerKAPLAN,Joe T     Account Number:  [id number]     Admit date:  01/09/2014  Clinical Social Worker:  Orpah GreekFOLEY,Aamirah Salmi, LCSWA  Date/Time:  01/11/2014 02:14 PM  Referred by:  Physician  Date Referred:  01/11/2014 Referred for  Other - See comment   Other Referral:   anxiety and depression resources   Interview type:  Patient Other interview type:    PSYCHOSOCIAL DATA Living Status:  OTHER Admitted from facility:   Level of care:   Primary support name:  Lavell LusterMichael Mahl (father) ph#: [phone] Primary support relationship to patient:  PARENT Degree of support available:   good    CURRENT CONCERNS Current Concerns  Other - See comment   Other Concerns:    SOCIAL WORK ASSESSMENT / PLAN CSW received consult from Dr. Elvera LennoxGherghe that patient is in need of resources for anxiety/depression.   Assessment/plan status:  [facility] Other assessment/ plan:   Information/referral to community resources:   CSW provided patient with list of outpatient psychiatrists/counselors and advised 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    PATIENT'S/FAMILY'S RESPONSE TO PLAN OF CARE: CSW spoke with patient and provided resources for anxiety/depression, encouraged patient to try the [facility] @ [employer] first as it would be most convenient for him since he lives on campus. Patient states [place] and are supportive and involved.       Lincoln MaxinKelly Maxon Kresse, LCSW [facility] Clinical Social Worker cell #: [phone]

## 2014-01-11 NOTE — Discharge Summary (Signed)
Physician Discharge Summary  Joe Long ZOX:[id number] DOB: 1990-12-04 DOA: 01/09/2014  PCP: Provider Not In System  Admit date: 01/09/2014 Discharge date: 01/11/2014  Time spent: 35 minutes  Recommendations for Outpatient Follow-up:  1. Followup with PCP in 1-2 weeks   Discharge Diagnoses:  Principal Problem:   UTI (lower urinary tract infection) Active Problems:   h/o Epididymitis   Cough   Bronchitis   Right flank pain  Discharge Condition: stable  Diet recommendation: regular  Filed Weights   01/09/14 2300 01/10/14 0419  Weight: 89.2 kg (196 lb 10.4 oz) 88.5 kg (195 lb 1.7 oz)   History of present illness:  Joe Long is a 23 y.o. male with Past medical history of asthma and bilateral epididymidis. The patient is coming from home. The patient presents with complaints of one week of fever with chills myalgia and productive cough. He has yellowish sputum expectoration. He denies any recent travel.  Pt denies any chest pain, palpitation, orthopnea, PND, active bleeding,pedal edema, focal neurological deficit.  Shortness of breath earlier this morning along with that he had some dizziness throughout the day.  Since last to this she started having episodes of burning urination without any blood. He had nausea but denied vomiting. He also noticed of diarrhea or loose watery bowel movement. Some over-the-counter Tylenol and Mucinex but that did not help him.  He had unprotected sexual intercourse 2-3 days ago with his male partner prior to this symptom onset.  He has history of epididymidis in the past but currently the symptoms are not similar to that. He has on and off testicular pain on right side which lasts for seconds and then resolve but most of her symptoms in the last few days. He denies any decreased urination or difficulty urinating.  He smokes on and off, use marijuana on and off, alcohol without any abuse.  He is monogamous in his relationship and has been checked for  STD 4 months ago and it was negative.  Hospital Course:  Sepsis due to Urinary tract infection - on presentation, patient tachycardic, febrile with significant leukocytosis and a positive urinalysis. Patient was started empirically on ceftriaxone, urine culture speciated Escherichia coli sensitive to ciprofloxacin. He is to complete a 14 day course of antibiotics for complicated UTI. Post antibiotics, patient was afebrile, white count decreased to 12.6 on discharge. Anxiety - patient with anxiety/depression issues. I started Zoloft. Social worker has been consulted and has been able to provide patient with outpatient resources for counseling. This is to be followup as an outpatient by spiral care provider.  Procedures:  none   Consultations:  none  Discharge Exam: Filed Vitals:   01/10/14 1416 01/10/14 2156 01/11/14 0631 01/11/14 0735  BP: 121/65 114/66 116/66 122/77  Pulse: 78 74 68 64  Temp: 97.9 F (36.6 C) 98.2 F (36.8 C) 97.8 F (36.6 C) 97.7 F (36.5 C)  TempSrc: Oral Oral Oral Oral  Resp: 18 20 16 18   Height:      Weight:      SpO2: 100% 100% 100% 100%    General: No acute distress Cardiovascular: Regular rate and rhythm Respiratory: Clear to auscultation bilaterally  Discharge Instructions     Medication List         acetaminophen 500 MG tablet  Commonly known as:  TYLENOL  Take 500 mg by mouth every 6 (six) hours as needed for moderate pain or headache.     ciprofloxacin 500 MG tablet  Commonly known as:  CIPRO  Take 1 tablet (500 mg total) by mouth 2 (two) times daily.     guaiFENesin 100 MG/5ML Soln  Commonly known as:  ROBITUSSIN  Take 10 mLs by mouth every 4 (four) hours as needed for cough or to loosen phlegm.     MUCINEX SINUS-MAX 10-650-400 MG/20ML Liqd  Generic drug:  Phenylephrine-APAP-Guaifenesin  Take 20 mLs by mouth every 4 (four) hours as needed (cough).     multivitamin with minerals Tabs tablet  Take 1 tablet by mouth daily.      sertraline 50 MG tablet  Commonly known as:  ZOLOFT  Take 1 tablet (50 mg total) by mouth daily.         The results of significant diagnostics from this hospitalization (including imaging, microbiology, ancillary and laboratory) are listed below for reference.    Significant Diagnostic Studies: Dg Chest 2 View  01/09/2014   CLINICAL DATA:  Cough and chest congestion. Shortness of breath and fever. Ex-smoker as of 1 month ago.  EXAM: CHEST  2 VIEW  COMPARISON:  None.  FINDINGS: Midline trachea. Normal heart size and mediastinal contours. No pleural effusion or pneumothorax. Mild interstitial thickening. No lobar consolidation.  IMPRESSION: No acute cardiopulmonary disease.  Mild interstitial thickening, likely related to chronic bronchitis/smoking.   Electronically Signed   By: Jeronimo GreavesKyle  Talbot M.D.   On: 01/09/2014 18:18   Ct Abdomen Pelvis W Contrast  01/09/2014   CLINICAL DATA:  Left flank pain  EXAM: CT ABDOMEN AND PELVIS WITH CONTRAST  TECHNIQUE: Multidetector CT imaging of the abdomen and pelvis was performed using the standard protocol following bolus administration of intravenous contrast.  CONTRAST:  100 mL Omnipaque 300  COMPARISON:  None.  FINDINGS: The liver, spleen, pancreas, gallbladder, adrenal glands and kidneys are normal. There is no CT evidence of left pyelonephritis. The aorta is normal. There is no abdominal lymphadenopathy. There is no small bowel obstruction or diverticulitis. There is no evidence of appendicitis.  Fluid-filled bladder is normal. The lung bases are clear. No acute abnormalities identified within the visualized bones.  IMPRESSION: No acute abnormality identified in the abdomen and pelvis. No CT evidence of pyelonephritis.   Electronically Signed   By: Sherian ReinWei-Chen  Lin M.D.   On: 01/09/2014 21:05    Microbiology: Recent Results (from the past 240 hour(s))  CULTURE, BLOOD (ROUTINE X 2)     Status: None   Collection Time    01/09/14  8:25 PM      Result Value Ref  Range Status   Specimen Description BLOOD LEFT ARM   Final   Special Requests BOTTLES DRAWN AEROBIC AND ANAEROBIC 3ML   Final   Culture  Setup Time     Final   Value: 01/10/2014 00:12     Performed at [facility]   Culture     Final   Value:        BLOOD CULTURE RECEIVED NO GROWTH TO DATE CULTURE WILL BE HELD FOR 5 DAYS BEFORE ISSUING A FINAL NEGATIVE REPORT     Performed at [facility]   Report Status PENDING   Incomplete  CULTURE, BLOOD (ROUTINE X 2)     Status: None   Collection Time    01/09/14  8:30 PM      Result Value Ref Range Status   Specimen Description BLOOD LEFT FOREARM   Final   Special Requests BOTTLES DRAWN AEROBIC ONLY 3ML   Final   Culture  Setup Time  Final   Value: 01/10/2014 00:12     Performed at [facility]   Culture     Final   Value:        BLOOD CULTURE RECEIVED NO GROWTH TO DATE CULTURE WILL BE HELD FOR 5 DAYS BEFORE ISSUING A FINAL NEGATIVE REPORT     Performed at [facility]   Report Status PENDING   Incomplete  URINE CULTURE     Status: None   Collection Time    01/09/14  9:03 PM      Result Value Ref Range Status   Specimen Description URINE, CATHETERIZED   Final   Special Requests NONE   Final   Culture  Setup Time     Final   Value: 01/10/2014 01:11     Performed at [facility] Count     Final   Value: >=100,000 COLONIES/ML     Performed at [facility]   Culture     Final   Value: ESCHERICHIA COLI     Performed at [facility]   Report Status 01/11/2014 FINAL   Final   Organism ID, Bacteria ESCHERICHIA COLI   Final  GC/CHLAMYDIA PROBE AMP     Status: None   Collection Time    01/10/14 12:06 AM      Result Value Ref Range Status   CT Probe RNA NEGATIVE  NEGATIVE Final   GC Probe RNA NEGATIVE  NEGATIVE Final   Comment: (NOTE)                                                                                               **Normal Reference Range: Negative**           Assay performed using the Gen-Probe APTIMA COMBO2 (R) Assay.     Acceptable specimen types for this assay include APTIMA Swabs (Unisex,     endocervical, urethral, or vaginal), first void urine, and ThinPrep     liquid based cytology samples.     Performed at [facility]: Basic Metabolic Panel:  Recent Labs Lab 01/09/14 1830 01/10/14 0355  NA 137 137  K 4.0 3.8  CL 98 102  CO2 24 24  GLUCOSE 92 108*  BUN 7 8  CREATININE 0.72 0.81  CALCIUM 9.5 8.6   Liver Function Tests:  Recent Labs Lab 01/10/14 0355  AST 13  ALT 16  ALKPHOS 53  BILITOT 0.6  PROT 6.3  ALBUMIN 3.5   CBC:  Recent Labs Lab 01/09/14 1830 01/10/14 0355 01/11/14 0502  WBC 20.8* 19.5* 12.6*  NEUTROABS 16.6*  --   --   HGB 14.9 13.8 13.5  HCT 42.0 41.7 40.6  MCV 84.2 85.5 85.5  PLT 266 202 211     Signed:  Bev Drennen  [facility] 01/11/2014, 7:05 PM

## 2014-01-11 NOTE — Progress Notes (Signed)
Patient's IV removed and tele, discharge instructions with two prescriptions given to patient.  Patient understands everything and does not have any further questions.

## 2014-01-11 NOTE — Progress Notes (Signed)
Pt became nausea during Zithromax infusing-- NP called gave dose of Zofran, then Phenergan. Cont infusing per NP and nausea improved after completion of infusion. SRP, RN

## 2014-01-16 LAB — CULTURE, BLOOD (ROUTINE X 2)
Culture: NO GROWTH
Culture: NO GROWTH

## 2014-03-26 ENCOUNTER — Emergency Department (HOSPITAL_COMMUNITY)
Admission: EM | Admit: 2014-03-26 | Discharge: 2014-03-26 | Disposition: A | Discharge status: Home / Self Care | Payer: 59 | Attending: Emergency Medicine | Admitting: Emergency Medicine

## 2014-03-26 ENCOUNTER — Encounter (HOSPITAL_COMMUNITY): Payer: Self-pay | Admitting: Emergency Medicine

## 2014-03-26 DIAGNOSIS — Z79899 Other long term (current) drug therapy: Secondary | ICD-10-CM | POA: Insufficient documentation

## 2014-03-26 DIAGNOSIS — J45909 Unspecified asthma, uncomplicated: Secondary | ICD-10-CM | POA: Insufficient documentation

## 2014-03-26 DIAGNOSIS — F32A Depression, unspecified: Secondary | ICD-10-CM

## 2014-03-26 DIAGNOSIS — F329 Major depressive disorder, single episode, unspecified: Secondary | ICD-10-CM

## 2014-03-26 DIAGNOSIS — Z87448 Personal history of other diseases of urinary system: Secondary | ICD-10-CM | POA: Insufficient documentation

## 2014-03-26 DIAGNOSIS — F3289 Other specified depressive episodes: Secondary | ICD-10-CM

## 2014-03-26 DIAGNOSIS — F411 Generalized anxiety disorder: Secondary | ICD-10-CM | POA: Insufficient documentation

## 2014-03-26 DIAGNOSIS — F419 Anxiety disorder, unspecified: Secondary | ICD-10-CM | POA: Diagnosis present

## 2014-03-26 DIAGNOSIS — Z87891 Personal history of nicotine dependence: Secondary | ICD-10-CM | POA: Insufficient documentation

## 2014-03-26 DIAGNOSIS — Z792 Long term (current) use of antibiotics: Secondary | ICD-10-CM | POA: Insufficient documentation

## 2014-03-26 LAB — CBC WITH DIFFERENTIAL/PLATELET
BASOS PCT: 0 % (ref 0–1)
Basophils Absolute: 0 10*3/uL (ref 0.0–0.1)
Eosinophils Absolute: 0.1 10*3/uL (ref 0.0–0.7)
Eosinophils Relative: 1 % (ref 0–5)
HCT: 47.4 % (ref 39.0–52.0)
Hemoglobin: 15.7 g/dL (ref 13.0–17.0)
Lymphocytes Relative: 28 % (ref 12–46)
Lymphs Abs: 1.9 10*3/uL (ref 0.7–4.0)
MCH: 28.1 pg (ref 26.0–34.0)
MCHC: 33.1 g/dL (ref 30.0–36.0)
MCV: 84.9 fL (ref 78.0–100.0)
MONO ABS: 0.6 10*3/uL (ref 0.1–1.0)
Monocytes Relative: 8 % (ref 3–12)
Neutro Abs: 4.4 10*3/uL (ref 1.7–7.7)
Neutrophils Relative %: 63 % (ref 43–77)
Platelets: 269 10*3/uL (ref 150–400)
RBC: 5.58 MIL/uL (ref 4.22–5.81)
RDW: 13.7 % (ref 11.5–15.5)
WBC: 7 10*3/uL (ref 4.0–10.5)

## 2014-03-26 LAB — BASIC METABOLIC PANEL
BUN: 9 mg/dL (ref 6–23)
CO2: 29 mEq/L (ref 19–32)
CREATININE: 0.8 mg/dL (ref 0.50–1.35)
Calcium: 9.7 mg/dL (ref 8.4–10.5)
Chloride: 103 mEq/L (ref 96–112)
GFR calc non Af Amer: >90 mL/min (ref >90)
Glucose, Bld: 83 mg/dL (ref 70–99)
Potassium: 4.1 mEq/L (ref 3.7–5.3)
Sodium: 144 mEq/L (ref 137–147)

## 2014-03-26 LAB — ETHANOL: Alcohol, Ethyl (B): <11 mg/dL (ref 0–11)

## 2014-03-26 NOTE — ED Notes (Signed)
Pt's friend at bedside reiterates that pt will be staying with her tonight and pt states he will be safe there.

## 2014-03-26 NOTE — Consult Note (Signed)
[facility] Consult   Reason for Consult:  Anxiety Referring Physician:  EDP Joe Long is an 23 y.o. male. Total Time spent with patient: 30 minutes  Assessment: AXIS I:  Anxiety Disorder NOS and Depressive Disorder NOS AXIS II:  Deferred AXIS III:   Past Medical History  Diagnosis Date  . Asthma   . Epididymitis, bilateral 10/30/2013   AXIS IV:  educational problems AXIS V:  61-70 mild symptoms  Plan:  No evidence of imminent risk to self or others at present.    Subjective:   Joe Long is a 23 y.o. male patient does not warrant admission.  HPI:  The patient had not slept well on Friday and Saturday night.  Then, he had a panic attack last night.  He does report a good night's sleep last.  The reason he is here is because his primary care provider sent him here for medication evaluation.  She had started him on 25 mg of Zoloft which made him irritable and he stopped taking it.  He also uses 0.5 mg to 1 mg BID for anxiety.  Stoy wants to have his depression medication regulated, something else besides Zoloft.  He lives with three other students who are supportive and with him in the ED.  Selena Lesser sees a Social worker at his college also but only every 2-3 weeks, would like to see one more often.  Resources given.  Denies suicidal/homicidal ideations and hallucinations.  He has never hurt himself, no cutting behaviors, and no psychiatry care. HPI Elements:   Location:  generalized. Quality:  acute. Severity:  severe. Timing:  intermittent. Duration:  two weeks. Context:  stressors.  Past Psychiatric History: Past Medical History  Diagnosis Date  . Asthma   . Epididymitis, bilateral 10/30/2013    reports that he quit smoking about 3 months ago. His smoking use included Cigarettes. He has a 1 pack-year smoking history. He has never used smokeless tobacco. He reports that he drinks about 1.8 ounces of alcohol per week. He reports that he uses illicit drugs  (Marijuana) about twice per week. No family history on file.         Allergies:   Allergies  Allergen Reactions  . Onion Hives and Shortness Of Breath  . Chocolate Hives  . Peach [Prunus Persica] Hives  . Strawberry Hives    ACT Assessment Complete:  Yes:    Educational Status    Risk to Self: Risk to self Is patient at risk for suicide?: No, but patient needs Medical Clearance Substance abuse history and/or treatment for substance abuse?: No  Risk to Others:    Abuse:    Prior Inpatient Therapy:    Prior Outpatient Therapy:    Additional Information:                    Objective: Blood pressure 128/73, pulse 91, temperature 98.4 F (36.9 C), temperature source Oral, resp. rate 18, SpO2 100.00%.There is no weight on file to calculate BMI. Results for orders placed during the hospital encounter of 03/26/14 (from the past 72 hour(s))  CBC WITH DIFFERENTIAL     Status: None   Collection Time    03/26/14  5:55 PM      Result Value Ref Range   WBC 7.0  4.0 - 10.5 K/uL   RBC 5.58  4.22 - 5.81 MIL/uL   Hemoglobin 15.7  13.0 - 17.0 g/dL   HCT 47.4  39.0 - 52.0 %  MCV 84.9  78.0 - 100.0 fL   MCH 28.1  26.0 - 34.0 pg   MCHC 33.1  30.0 - 36.0 g/dL   RDW 13.7  11.5 - 15.5 %   Platelets 269  150 - 400 K/uL   Neutrophils Relative % 63  43 - 77 %   Neutro Abs 4.4  1.7 - 7.7 K/uL   Lymphocytes Relative 28  12 - 46 %   Lymphs Abs 1.9  0.7 - 4.0 K/uL   Monocytes Relative 8  3 - 12 %   Monocytes Absolute 0.6  0.1 - 1.0 K/uL   Eosinophils Relative 1  0 - 5 %   Eosinophils Absolute 0.1  0.0 - 0.7 K/uL   Basophils Relative 0  0 - 1 %   Basophils Absolute 0.0  0.0 - 0.1 K/uL  BASIC METABOLIC PANEL     Status: None   Collection Time    03/26/14  5:55 PM      Result Value Ref Range   Sodium 144  137 - 147 mEq/L   Potassium 4.1  3.7 - 5.3 mEq/L   Chloride 103  96 - 112 mEq/L   CO2 29  19 - 32 mEq/L   Glucose, Bld 83  70 - 99 mg/dL   BUN 9  6 - 23 mg/dL    Creatinine, Ser 0.80  0.50 - 1.35 mg/dL   Calcium 9.7  8.4 - 10.5 mg/dL   GFR calc non Af Amer >90  >90 mL/min   GFR calc Af Amer >90  >90 mL/min   Comment: (NOTE)     The eGFR has been calculated using the CKD EPI equation.     This calculation has not been validated in all clinical situations.     eGFR's persistently <90 mL/min signify possible Chronic Kidney     Disease.  ETHANOL     Status: None   Collection Time    03/26/14  5:55 PM      Result Value Ref Range   Alcohol, Ethyl (B) <11  0 - 11 mg/dL   Comment:            LOWEST DETECTABLE LIMIT FOR     SERUM ALCOHOL IS 11 mg/dL     FOR MEDICAL PURPOSES ONLY   Labs are reviewed and are pertinent for no medical issues.  No current facility-administered medications for this encounter.   Current Outpatient Prescriptions  Medication Sig Dispense Refill  . acetaminophen (TYLENOL) 500 MG tablet Take 500 mg by mouth every 6 (six) hours as needed for moderate pain or headache.      . ciprofloxacin (CIPRO) 500 MG tablet Take 1 tablet (500 mg total) by mouth 2 (two) times daily.  26 tablet  0  . guaiFENesin (ROBITUSSIN) 100 MG/5ML SOLN Take 10 mLs by mouth every 4 (four) hours as needed for cough or to loosen phlegm.      . Multiple Vitamin (MULTIVITAMIN WITH MINERALS) TABS tablet Take 1 tablet by mouth daily.      Marland Kitchen Phenylephrine-APAP-Guaifenesin (MUCINEX SINUS-MAX) 10-650-400 MG/20ML LIQD Take 20 mLs by mouth every 4 (four) hours as needed (cough).      . sertraline (ZOLOFT) 50 MG tablet Take 1 tablet (50 mg total) by mouth daily.  30 tablet  1    Psychiatric Specialty Exam:     Blood pressure 128/73, pulse 91, temperature 98.4 F (36.9 C), temperature source Oral, resp. rate 18, SpO2 100.00%.There is no weight on file to  calculate BMI.  General Appearance: Casual  Eye Contact::  Good  Speech:  Normal Rate  Volume:  Normal  Mood:  Anxious  Affect:  Congruent  Thought Process:  Coherent  Orientation:  Full (Time, Place, and  Person)  Thought Content:  WDL  Suicidal Thoughts:  No  Homicidal Thoughts:  No  Memory:  Immediate;   Good Recent;   Good Remote;   Good  Judgement:  Good  Insight:  Good  Psychomotor Activity:  Normal  Concentration:  Good  Recall:  Good  Fund of Knowledge:Good  Language: Good  Akathisia:  No  Handed:  Right  AIMS (if indicated):     Assets:  Communication Skills Housing Intimacy Leisure Time Physical Health Resilience Social Support Transportation Vocational/Educational  Sleep:      Musculoskeletal: Strength & Muscle Tone: within normal limits Gait & Station: normal Patient leans: Right  Treatment Plan Summary: Follow-up with provider and therapist in outpatient and at his college.  Waylan Boga, PMH-NP 03/26/2014 7:07 PM Notes reviewed and agree with above.

## 2014-03-26 NOTE — [facility] Suicide Risk Assessment (Signed)
Suicide Risk Assessment  Discharge Assessment     Demographic Factors:  Male, Adolescent or young adult and Caucasian  Total Time spent with patient: 30 minutes  Psychiatric Specialty Exam:     Blood pressure 128/73, pulse 91, temperature 98.4 F (36.9 C), temperature source Oral, resp. rate 18, SpO2 100.00%.There is no weight on file to calculate BMI.  General Appearance: Casual  Eye Contact::  Good  Speech:  Normal Rate  Volume:  Normal  Mood:  Anxious  Affect:  Congruent  Thought Process:  Coherent  Orientation:  Full (Time, Place, and Person)  Thought Content:  WDL  Suicidal Thoughts:  No  Homicidal Thoughts:  No  Memory:  Immediate;   Good Recent;   Good Remote;   Good  Judgement:  Good  Insight:  Good  Psychomotor Activity:  Normal  Concentration:  Good  Recall:  Good  Fund of Knowledge:Good  Language: Good  Akathisia:  No  Handed:  Right  AIMS (if indicated):     Assets:  Communication Skills Housing Intimacy Leisure Time Physical Health Resilience Social Support Transportation Vocational/Educational  Sleep:      Musculoskeletal: Strength & Muscle Tone: within normal limits Gait & Station: normal Patient leans: Right   Mental Status Per Nursing Assessment::   On Admission:   Anxious, denies suicidal/homicidal ideations and hallucinations  Current Mental Status by Physician: NA  Loss Factors: NA  Historical Factors: NA  Risk Reduction Factors:   Employed, Living with another person, especially a relative, Positive social support, Positive therapeutic relationship and Positive coping skills or problem solving skills  Continued Clinical Symptoms:  Anxiety, depression  Cognitive Features That Contribute To Risk:  NA  Suicide Risk:  Minimal: No identifiable suicidal ideation.  Patients presenting with no risk factors but with morbid ruminations; may be classified as minimal risk based on the severity of the depressive  symptoms  Discharge Diagnoses:   AXIS I:  Depressive Disorder NOS and Generalized Anxiety Disorder AXIS II:  Deferred AXIS III:   Past Medical History  Diagnosis Date  . Asthma   . Epididymitis, bilateral 10/30/2013   AXIS IV:  educational problems and problems related to social environment AXIS V:  61-70 mild symptoms  Plan Of Care/Follow-up recommendations:  Activity:  as tolerated Diet:  low-sodium heart healthy diet  Is patient on multiple antipsychotic therapies at discharge:  No   Has Patient had three or more failed trials of antipsychotic monotherapy by history:  No  Recommended Plan for Multiple Antipsychotic Therapies: NA    Nanine MeansJamison Lord, PMH-NP 03/26/2014, 7:17 PM

## 2014-03-26 NOTE — ED Notes (Signed)
Per pt, states he has been depressed and anxious for a few months now-meds not working-sent here for eval

## 2014-03-26 NOTE — ED Provider Notes (Signed)
TIME SEEN: 7:45 PM  CHIEF COMPLAINT: Depression and anxiety, prior history of suicidal ideation  HPI: Pt is a 23 y.o. M with history of asthma, depression and anxiety who was recently started on Zoloft and Klonopin who presents to the emergency department with complaints of worsening depression and anxiety. Patient is a Consulting civil engineerstudent at [employer] and is being followed by counselor. His psychiatric medications are being monitored and adjusted by his PCP. His primary care physician was concerned about his thoughts of suicide although these have not been current. He was sent to the emergency department from his PCPs office for evaluation. Patient denies any current SI, HI, hallucinations. No drug alcohol use. No acute medical complaints.  ROS: See HPI Constitutional: no fever  Eyes: no drainage  ENT: no runny nose   Cardiovascular:  no chest pain  Resp: no SOB  GI: no vomiting GU: no dysuria Integumentary: no rash  Allergy: no hives  Musculoskeletal: no leg swelling  Neurological: no slurred speech ROS otherwise negative  PAST MEDICAL HISTORY/PAST SURGICAL HISTORY:  Past Medical History  Diagnosis Date  . Asthma   . Epididymitis, bilateral 10/30/2013    MEDICATIONS:  Prior to Admission medications   Medication Sig Start Date End Date Taking? Authorizing Provider  acetaminophen (TYLENOL) 500 MG tablet Take 500 mg by mouth every 6 (six) hours as needed for moderate pain or headache.    Historical Provider, MD  ciprofloxacin (CIPRO) 500 MG tablet Take 1 tablet (500 mg total) by mouth 2 (two) times daily. 01/11/14   Costin Otelia SergeantM Gherghe, MD  guaiFENesin (ROBITUSSIN) 100 MG/5ML SOLN Take 10 mLs by mouth every 4 (four) hours as needed for cough or to loosen phlegm.    Historical Provider, MD  Multiple Vitamin (MULTIVITAMIN WITH MINERALS) TABS tablet Take 1 tablet by mouth daily.    Historical Provider, MD  Phenylephrine-APAP-Guaifenesin [facility](MUCINEX SINUS-MAX) 10-650-400 MG/20ML LIQD Take 20 mLs by mouth every 4  (four) hours as needed (cough).    Historical Provider, MD  sertraline (ZOLOFT) 50 MG tablet Take 1 tablet (50 mg total) by mouth daily. 01/11/14   Costin Otelia SergeantM Gherghe, MD    ALLERGIES:  Allergies  Allergen Reactions  . Onion Hives and Shortness Of Breath  . Chocolate Hives  . Peach [Prunus Persica] Hives  . Strawberry Hives    SOCIAL HISTORY:  History  Substance Use Topics  . Smoking status: Former Smoker -- 0.50 packs/day for 2 years    Types: Cigarettes    Quit date: 11/30/2013  . Smokeless tobacco: Never Used  . Alcohol Use: 1.8 oz/week    1 Cans of beer, 2 Shots of liquor per week    FAMILY HISTORY: No family history on file.  EXAM: BP 128/73  Pulse 91  Temp(Src) 98.4 F (36.9 C) (Oral)  Resp 18  SpO2 100% CONSTITUTIONAL: Alert and oriented and responds appropriately to questions. Well-appearing; well-nourished HEAD: Normocephalic EYES: Conjunctivae clear, PERRL ENT: normal nose; no rhinorrhea; moist mucous membranes; pharynx without lesions noted NECK: Supple, no meningismus, no LAD  CARD: RRR; S1 and S2 appreciated; no murmurs, no clicks, no rubs, no gallops RESP: Normal chest excursion without splinting or tachypnea; breath sounds clear and equal bilaterally; no wheezes, no rhonchi, no rales,  ABD/GI: Normal bowel sounds; non-distended; soft, non-tender, no rebound, no guarding BACK:  The back appears normal and is non-tender to palpation, there is no CVA tenderness EXT: Normal ROM in all joints; non-tender to palpation; no edema; normal capillary refill; no cyanosis  SKIN: Normal color for age and race; warm NEURO: Moves all extremities equally PSYCH: The patient's mood and manner are appropriate. Grooming and personal hygiene are appropriate. No SI, HI or hallucinations. Patient is able to contract for safety.  MEDICAL DECISION MAKING: Patient sent here for concerns for suicidal thoughts other he is currently able to contract for safety. His medical screening  labs are negative. TTS is at bedside.  ED PROGRESS: Psychiatry has evaluated the patient feels he is safe to be discharged home. Patient is able to contract for safety. He is given outpatient resources. He has a Veterinary surgeoncounselor that he sees at [employer] and he sees his PCP for prescription for Zoloft and Klonopin. Patient has a friend at bedside states she will stay with him for the next several days. Patient, friend at bedside, psychiatry and myself were comfortable with plan for discharge home.     Layla MawKristen N Carolyn Maniscalco, DO 03/26/14 2343

## 2014-03-26 NOTE — Discharge Instructions (Signed)
Depression, Adult °Depression refers to feeling sad, low, down in the dumps, blue, gloomy, or empty. In general, there are two kinds of depression: °1. Depression that we all experience from time to time because of upsetting life experiences, including the loss of a job or the ending of a relationship (normal sadness or normal grief). This kind of depression is considered normal, is short lived, and resolves within a few days to 2 weeks. (Depression experienced after the loss of a loved one is called bereavement. Bereavement often lasts longer than 2 weeks but normally gets better with time.) °2. Clinical depression, which lasts longer than normal sadness or normal grief or interferes with your ability to function at home, at work, and in school. It also interferes with your personal relationships. It affects almost every aspect of your life. Clinical depression is an illness. °Symptoms of depression also can be caused by conditions other than normal sadness and grief or clinical depression. Examples of these conditions are listed as follows: °· Physical illness Some physical illnesses, including underactive thyroid gland (hypothyroidism), severe anemia, specific types of cancer, diabetes, uncontrolled seizures, heart and lung problems, strokes, and chronic pain are commonly associated with symptoms of depression. °· Side effects of some prescription medicine In some people, certain types of prescription medicine can cause symptoms of depression. °· Substance abuse Abuse of alcohol and illicit drugs can cause symptoms of depression. °SYMPTOMS °Symptoms of normal sadness and normal grief include the following: °· Feeling sad or crying for short periods of time. °· Not caring about anything (apathy). °· Difficulty sleeping or sleeping too much. °· No longer able to enjoy the things you used to enjoy. °· Desire to be by oneself all the time (social isolation). °· Lack of energy or motivation. °· Difficulty  concentrating or remembering. °· Change in appetite or weight. °· Restlessness or agitation. °Symptoms of clinical depression include the same symptoms of normal sadness or normal grief and also the following symptoms: °· Feeling sad or crying all the time. °· Feelings of guilt or worthlessness. °· Feelings of hopelessness or helplessness. °· Thoughts of suicide or the desire to harm yourself (suicidal ideation). °· Loss of touch with reality (psychotic symptoms). Seeing or hearing things that are not real (hallucinations) or having false beliefs about your life or the people around you (delusions and paranoia). °DIAGNOSIS  °The diagnosis of clinical depression usually is based on the severity and duration of the symptoms. Your caregiver also will ask you questions about your medical history and substance use to find out if physical illness, use of prescription medicine, or substance abuse is causing your depression. Your caregiver also may order blood tests. °TREATMENT  °Typically, normal sadness and normal grief do not require treatment. However, sometimes antidepressant medicine is prescribed for bereavement to ease the depressive symptoms until they resolve. °The treatment for clinical depression depends on the severity of your symptoms but typically includes antidepressant medicine, counseling with a mental health professional, or a combination of both. Your caregiver will help to determine what treatment is best for you. °Depression caused by physical illness usually goes away with appropriate medical treatment of the illness. If prescription medicine is causing depression, talk with your caregiver about stopping the medicine, decreasing the dose, or substituting another medicine. °Depression caused by abuse of alcohol or illicit drugs abuse goes away with abstinence from these substances. Some adults need professional help in order to stop drinking or using drugs. °SEEK IMMEDIATE CARE IF: °· You have   thoughts  about hurting yourself or others.  You lose touch with reality (have psychotic symptoms).  You are taking medicine for depression and have a serious side effect. FOR MORE INFORMATION National Alliance on Mental Illness: www.nami.[employer]org National Institute of Mental Health: http://www.maynard.net/www.nimh.nih.gov Document Released: 11/13/2000 Document Revised: 05/17/2012 Document Reviewed: 02/15/2012 [facility]Exit[facility] Patient Information 2014 RuthExitCare, MarylandLLC.  Social Anxiety Disorder Social anxiety disorder, previously called social phobia, is a mental disorder. People with social anxiety disorder frequently feel nervous, afraid, or embarrassed when around other people in social situations. They constantly worry that other people are judging or criticizing them for how they look, what they say, or how they act. They may worry that other people might reject them because of their appearance or behavior. Social anxiety disorder is more than just occasional shyness or self-consciousness. It can cause severe emotional distress. It can interfere with daily life activities. Social anxiety disorder also may lead to excessive alcohol or drug use and even suicide.  Social anxiety disorder is actually one of the most common mental disorders. It can develop at any time but usually starts in the teenage years. Women are more commonly affected than men. Social anxiety disorder is also more common in people who have family members with anxiety disorders. It also is more common in people who have physical deformities or conditions with characteristics that are obvious to others, such as stuttered speech or movement abnormalities (Parkinson disease).  SYMPTOMS  In addition to feeling anxious or fearful in social situations, people with social anxiety disorder frequently have physical symptoms. Examples include:  Red face (blushing).  Racing heart.  Sweating.  Shaky hands or voice.  Confusion.  Lightheadedness.  Upset stomach and  diarrhea. DIAGNOSIS  Social anxiety disorder is diagnosed through an assessment by your caregiver. Your caregiver will ask you questions about your mood, thoughts, and reactions in social situations. Your caregiver may ask you about your medical history and use of alcohol or drugs, including prescription medications. Certain medical conditions and the use of certain substances, including caffeine, can cause symptoms similar to social anxiety disorder. Your caregiver may refer you to a mental health specialist for further evaluation or treatment. The criteria for diagnosis of social anxiety disorder are:  Marked fear or anxiety in one or more social situations in which you may be closely watched or studied by others. Examples of such situations include:  Interacting socially (having a conversation with others, going to a party, or meeting strangers).  Being observed (eating or drinking in public or being called on in class).  Performing in front of others (giving a speech).  The social situations of concern almost always cause fear or anxiety, not just occasionally.  People with social anxiety disorder fear that they will be viewed negatively in a way that will be embarrassing, will lead to rejection, or will offend others. This fear is out of proportion to the actual threat posed by the social situation.  Often the triggering social situations are avoided, or they are endured with intense fear or anxiety. The fear, anxiety, or avoidance is persistent and lasts for 6 months or longer.  The anxiety causes difficulty functioning in at least some parts of your daily life. TREATMENT  Several types of treatment are available for social anxiety disorder. These treatments are often used in combination and include:   Talk therapy. Group talk therapy allows you to see that you are not alone with these problems. Individual talk therapy helps you address your specific  anxiety issues with a caring  professional. The most effective forms of talk therapy for social anxiety disorder are cognitive behavioral therapy and exposure therapy. Cognitive behavioral therapy helps you to identify and change negative thoughts and beliefs that are at the root of the disorder. Exposure therapy allows you to gradually face the situations that you fear most.  Relaxation and coping techniques. These include deep breathing, self-talk, meditation, visual imagery, and yoga. Relaxation techniques help to keep you calm in social situations.  Social Optician, dispensingskills training.Social skills can be learned on your own or with the help of a talk therapist. They can help you feel more confident and comfortable in social situations.  Medication. For anxiety limited to performance situations (performance anxiety), medication called beta blockers can help by reducing or preventing the physical symptoms of social anxiety disorder. For more persistent and generalized social anxiety, antidepressant medication may be prescribed to help control symptoms. In severe cases of social anxiety disorder, strong antianxiety medication, called benzodiazepines, may be prescribed on a limited basis and for a short time. Document Released: 10/15/2005 Document Revised: 03/13/2013 Document Reviewed: 02/14/2013 [facility] Patient Information 2014 [place]fordExitCare, MarylandLLC.

## 2014-04-18 ENCOUNTER — Encounter (HOSPITAL_COMMUNITY): Payer: Self-pay | Admitting: Psychiatry

## 2014-04-18 ENCOUNTER — Ambulatory Visit (INDEPENDENT_AMBULATORY_CARE_PROVIDER_SITE_OTHER): Payer: 59 | Admitting: Psychiatry

## 2014-04-18 VITALS — BP 151/81 | HR 92 | Ht 71.5 in | Wt 184.8 lb

## 2014-04-18 DIAGNOSIS — F39 Unspecified mood [affective] disorder: Secondary | ICD-10-CM

## 2014-04-18 DIAGNOSIS — F419 Anxiety disorder, unspecified: Secondary | ICD-10-CM

## 2014-04-18 DIAGNOSIS — F3181 Bipolar II disorder: Secondary | ICD-10-CM

## 2014-04-18 MED ORDER — HYDROXYZINE PAMOATE 25 MG PO CAPS: ORAL_CAPSULE | ORAL | Status: DC

## 2014-04-18 MED ORDER — LAMOTRIGINE 25 MG PO TABS: ORAL_TABLET | ORAL | Status: DC
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
## 2014-05-03 ENCOUNTER — Ambulatory Visit (HOSPITAL_COMMUNITY): Payer: 59 | Admitting: Psychiatry

## 2014-05-03 DIAGNOSIS — F3181 Bipolar II disorder: Secondary | ICD-10-CM

## 2014-05-04 NOTE — Progress Notes (Signed)
THERAPIST PROGRESS NOTE  Presenting Problem Chief Complaint: depression, anxiety  What are the main stressors in your life right now, how long? Anger/irritability, low frustration tolerance; friend relationships, father not accepting of bisexuality  Previous mental health services Have you ever been treated for a mental health problem, when, where, by whom? No     Are you currently seeing a therapist or counselor, counselor's name? No   Have you ever had a mental health hospitalization, how many times, length of stay? No    Have you ever had suicidal thoughts or attempted suicide, when, how? Pt. Reports no current suicidal thoughts; saw a therapist at [facility] in February 2015 because of suicidal thoughts but no plan or immediacy.  Risk factors for Suicide Demographic factors:  Male and Cardell PeachGay, lesbian, or bisexual orientation Current mental status: no suicidal ideation reported Loss factors: none Historical factors: none Risk Reduction factors: Living with another person, especially a relative Clinical factors:  Depression, anxiety Cognitive features that contribute to risk: none observed  SUICIDE RISK:  Minimal: No identifiable suicidal ideation.  Patients presenting with no risk factors but with morbid ruminations; may be classified as minimal risk based on the severity of the depressive symptoms   Social/family history Have you been married, how many times?  n/a  Do you have children?  no  How many pregnancies have you had?  none  Who lives in your current household? Lives with girlfriend  Military history: No   Religious/spiritual involvement:  What religion/faith base are you? Christian  Family of origin (childhood history)  Where were you born? [place] Where did you grow up? Moved to [place] 10 [place] ago  Describe the atmosphere of the household where you grew up: loving, attentive  Do you have siblings, step/half siblings, list  names, relation, sex, age? Yes   Are your parents separated/divorced, when and why? No  Are your parents alive? Yes   Social supports (personal and professional): close family of [place] descent, girlfriend, friends  Education How many grades have you completed? some college Did you have any problems in school, what type? No  Medications prescribed for these problems? No   Employment (financial issues) Pt. 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 No   Have you ever been exposed to something traumatic, describe? No   Substance use Do you use Caffeine? No Type, frequency? n/a  Do you use Nicotine? No Type, frequency, ppd? n/a  Do you use Alcohol? No Type, frequency? n/a  How old were you went you first tasted alcohol? n/a Was this accepted by your family? No  When was your last drink, type, how much? n/a  Have you ever used illicit drugs or taken more than prescribed, type, frequency, date of last usage? No   Mental Status: General Appearance /Behavior:  Casual Eye Contact:  Good Motor Behavior:  Normal Speech:  Normal Level of Consciousness:  Alert Mood:  Euthymic Affect:  Appropriate Anxiety Level:  minimal Thought Process:  Coherent Thought Content:  WNL Perception:  Normal Judgment:  Good Insight:  Present Cognition:  wnl  Diagnosis AXIS I Depressive Disorder NOS  AXIS II No diagnosis  AXIS III Past Medical History  Diagnosis Date  . Asthma   . Epididymitis, bilateral 10/30/2013    AXIS IV other psychosocial or environmental  problems  AXIS V 51-60 moderate symptoms   Plan: Pt. To return in 3-4 weeks and continue with CBT based treatment. Pt. Is of [place] descent, large immediate and extended family. Pt. Has transgender girlfriend, feels unaccepted by his father. Pt. Reports anger  and furious for no reason. Studying sociology and women and gender studies at [employer]. Pt. Reports history of not feeling "ok" with himself attributes to cuban/Catholic culture and bisexuality. Pt. Discussed core values of respect for others, acceptance, individualism, and history of pleasing and not wanting to disappoint others especially his parents.  _________________________________________        [name], Yong Channel 05/04/2014

## 2014-05-09 ENCOUNTER — Ambulatory Visit (INDEPENDENT_AMBULATORY_CARE_PROVIDER_SITE_OTHER): Payer: 59 | Admitting: Psychiatry

## 2014-05-09 ENCOUNTER — Encounter (HOSPITAL_COMMUNITY): Payer: Self-pay | Admitting: Psychiatry

## 2014-05-09 VITALS — BP 122/72 | HR 75 | Ht 71.0 in | Wt 187.4 lb

## 2014-05-09 DIAGNOSIS — F3181 Bipolar II disorder: Secondary | ICD-10-CM

## 2014-05-09 DIAGNOSIS — F39 Unspecified mood [affective] disorder: Secondary | ICD-10-CM

## 2014-05-09 MED ORDER — LAMOTRIGINE 100 MG PO TABS: 100.0000 mg | ORAL_TABLET | Freq: Every day | ORAL | Status: DC
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
## 2014-06-07 ENCOUNTER — Ambulatory Visit (INDEPENDENT_AMBULATORY_CARE_PROVIDER_SITE_OTHER): Payer: 59 | Admitting: Psychiatry

## 2014-06-07 ENCOUNTER — Encounter (HOSPITAL_COMMUNITY): Payer: Self-pay | Admitting: Psychiatry

## 2014-06-07 ENCOUNTER — Ambulatory Visit (HOSPITAL_COMMUNITY): Payer: Self-pay | Admitting: Psychiatry

## 2014-06-07 VITALS — BP 120/81 | HR 77 | Ht 72.0 in | Wt 194.4 lb

## 2014-06-07 DIAGNOSIS — F39 Unspecified mood [affective] disorder: Secondary | ICD-10-CM

## 2014-06-07 DIAGNOSIS — F419 Anxiety disorder, unspecified: Secondary | ICD-10-CM

## 2014-06-07 DIAGNOSIS — F3181 Bipolar II disorder: Secondary | ICD-10-CM

## 2014-06-07 MED ORDER — HYDROXYZINE PAMOATE 25 MG PO CAPS: ORAL_CAPSULE | ORAL | Status: DC

## 2014-06-07 MED ORDER — LAMOTRIGINE 100 MG PO TABS: 100.0000 mg | ORAL_TABLET | Freq: Every day | ORAL | Status: DC

## 2014-06-07 MED ORDER — LAMOTRIGINE 150 MG PO TABS: 150.0000 mg | ORAL_TABLET | Freq: Every day | ORAL | Status: DC
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
## 2014-06-14 ENCOUNTER — Telehealth (HOSPITAL_COMMUNITY): Payer: Self-pay | Admitting: *Deleted

## 2014-07-17 NOTE — Telephone Encounter (Signed)
Opened in error

## 2014-07-30 ENCOUNTER — Ambulatory Visit (HOSPITAL_COMMUNITY): Payer: Self-pay | Admitting: Psychiatry

## 2014-08-07 ENCOUNTER — Ambulatory Visit (HOSPITAL_COMMUNITY): Payer: Self-pay | Admitting: Psychiatry

## 2014-08-09 ENCOUNTER — Ambulatory Visit (HOSPITAL_COMMUNITY): Payer: Self-pay | Admitting: Psychiatry

## 2014-08-14 ENCOUNTER — Ambulatory Visit (INDEPENDENT_AMBULATORY_CARE_PROVIDER_SITE_OTHER): Payer: 59 | Admitting: Psychiatry

## 2014-08-14 DIAGNOSIS — F411 Generalized anxiety disorder: Secondary | ICD-10-CM

## 2014-08-14 DIAGNOSIS — F3189 Other bipolar disorder: Secondary | ICD-10-CM

## 2014-08-14 DIAGNOSIS — F419 Anxiety disorder, unspecified: Secondary | ICD-10-CM

## 2014-08-14 DIAGNOSIS — F3181 Bipolar II disorder: Secondary | ICD-10-CM

## 2014-08-15 NOTE — Progress Notes (Signed)
   THERAPIST PROGRESS NOTE  Session Time: 9:00-10:30  Participation Level: Active  Behavioral Response: CasualAlertEuthymic  Type of Therapy: Individual Therapy  Treatment Goals addressed: Coping  Interventions: CBT, strength-based  Summary: [facility] is a 23 y.o. male who presents with anxiety and depression.   Suicidal/Homicidal: Nowithout intent/plan  Therapist Response: Pt. Reports mild anxiety associated with multi-tasking. Pt.'s behavior was normalized in light of the number of stressors. Pt. Is working part-time and a Physicist, medical in upper level courses. Pt. Is enthusiastic about women-gender studies and political social issues. Pt. Recently engaged to partner who is transgender. Pt. Challenged by his father's attitude toward his sexuality and his partner. Significant time processing sexual identity development, acceptance/pain related to father's lack of acceptance, and feelings of injustice related to lack of acceptance of his relationship and partner.  Plan: Return again in 2 weeks.  Diagnosis: Axis I: Depressive Disorder NOS    Axis II: No diagnosis    Wynonia Musty 08/15/2014

## 2014-08-21 ENCOUNTER — Encounter (HOSPITAL_COMMUNITY): Payer: Self-pay | Admitting: Psychiatry

## 2014-08-21 ENCOUNTER — Ambulatory Visit (INDEPENDENT_AMBULATORY_CARE_PROVIDER_SITE_OTHER): Payer: 59 | Admitting: Psychiatry

## 2014-08-21 ENCOUNTER — Ambulatory Visit (HOSPITAL_COMMUNITY): Payer: Self-pay | Admitting: Psychiatry

## 2014-08-21 VITALS — BP 113/71 | HR 76 | Ht 72.0 in | Wt 198.0 lb

## 2014-08-21 DIAGNOSIS — F3181 Bipolar II disorder: Secondary | ICD-10-CM

## 2014-08-21 DIAGNOSIS — F39 Unspecified mood [affective] disorder: Secondary | ICD-10-CM

## 2014-08-21 MED ORDER — LAMOTRIGINE 150 MG PO TABS: 150.0000 mg | ORAL_TABLET | Freq: Every day | ORAL | Status: DC
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
## 2014-08-25 ENCOUNTER — Other Ambulatory Visit (HOSPITAL_COMMUNITY): Payer: Self-pay | Admitting: Psychiatry

## 2014-08-26 ENCOUNTER — Other Ambulatory Visit (HOSPITAL_COMMUNITY): Payer: Self-pay | Admitting: Psychiatry

## 2014-08-26 NOTE — Telephone Encounter (Signed)
Given on 08/21/14 with 2 refills.

## 2014-08-28 ENCOUNTER — Ambulatory Visit (INDEPENDENT_AMBULATORY_CARE_PROVIDER_SITE_OTHER): Payer: 59 | Admitting: Psychiatry

## 2014-08-28 DIAGNOSIS — F3181 Bipolar II disorder: Secondary | ICD-10-CM

## 2014-08-28 DIAGNOSIS — F3110 Bipolar disorder, current episode manic without psychotic features, unspecified: Secondary | ICD-10-CM

## 2014-08-28 NOTE — Progress Notes (Signed)
   THERAPIST PROGRESS NOTE  Session Time: 2:00-3:00   Participation Level: Active   Behavioral Response: CasualAlertAnxious   Type of Therapy: Individual Therapy   Treatment Goals addressed: Coping   Interventions: CBT, strength-based   Summary: Joe Long is a 23 y.o. male who presents with anxiety and depression.   Suicidal/Homicidal: Nowithout intent/plan   Therapist Response: Pt. Reports mild anxiety associated with feelings about his fiance. Pt. Anxiety and was able to discuss feelings of jealousy and feeling emotionally unsafe in the relationship. Pt.'s feelings were normalized. Mindfulness meditation was explained and Pt. Participated in meditation exercise to help develop attitude of acceptance towards range of emotions.   Plan: Continue with CBT based therapy.  Return again in 2 weeks.   Diagnosis: Axis I: Depressive Disorder NOS   Axis II: No diagnosis     Wynonia MustyBrown, Jennifer B, [facility] 08/28/2014

## 2014-09-04 ENCOUNTER — Ambulatory Visit (INDEPENDENT_AMBULATORY_CARE_PROVIDER_SITE_OTHER): Payer: 59 | Admitting: Psychiatry

## 2014-09-04 DIAGNOSIS — F419 Anxiety disorder, unspecified: Secondary | ICD-10-CM

## 2014-09-04 DIAGNOSIS — F3181 Bipolar II disorder: Secondary | ICD-10-CM
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
## 2014-09-11 ENCOUNTER — Ambulatory Visit (INDEPENDENT_AMBULATORY_CARE_PROVIDER_SITE_OTHER): Payer: 59 | Admitting: Psychiatry

## 2014-09-11 DIAGNOSIS — F3181 Bipolar II disorder: Secondary | ICD-10-CM

## 2014-09-13 NOTE — Progress Notes (Signed)
   THERAPIST PROGRESS NOTE   Session Time: 2:00-3:00   Participation Level: Active   Behavioral Response: CasualAlertAnxious   Type of Therapy: Individual Therapy   Treatment Goals addressed: Coping   Interventions: CBT, strength-based   Summary: Moreen FowlerBryan T Blauvelt is a 23 y.o. male who presents with anxiety and depression.   Suicidal/Homicidal: Nowithout intent/plan   Therapist Response: Pt. Continues to presents as talkative and mildly anxious. Pt. Reports that the past weekend was very difficult for him because his partner was out of town at a conference. Session focused on normalizing Pt.'s feelings of jealousy and desire to protect his partner given the relationship history. Pt. Discussed pattern of procrastination, need to be perceived as intellectual and capable communicator. Session focused on family history and invalidation of emotions and development of insecurity as a child.   Plan: Continue with CBT based therapy. Return again in 2 weeks.   Diagnosis: Axis I: Depressive Disorder NOS   Axis II: No diagnosis   Wynonia MustyBrown, Amarien Carne B, [facility] 09/13/2014

## 2014-10-11 ENCOUNTER — Telehealth (HOSPITAL_COMMUNITY): Payer: Self-pay | Admitting: *Deleted

## 2014-10-11 NOTE — Telephone Encounter (Signed)
Patient left VM: Insurance company sent him a letter telling him they needed more information regarding a claim.  Phoned patient_left message: No letter received by nurse. Will forward information to staff member who may be able to help or refer him to someone who can.

## 2014-11-20 ENCOUNTER — Ambulatory Visit (HOSPITAL_COMMUNITY): Payer: Self-pay | Admitting: Psychiatry

## 2014-11-27 ENCOUNTER — Other Ambulatory Visit (HOSPITAL_COMMUNITY): Payer: Self-pay | Admitting: Psychiatry

## 2014-12-03 ENCOUNTER — Ambulatory Visit (INDEPENDENT_AMBULATORY_CARE_PROVIDER_SITE_OTHER): Payer: 59 | Admitting: Psychiatry

## 2014-12-03 DIAGNOSIS — F3181 Bipolar II disorder: Secondary | ICD-10-CM

## 2014-12-05 NOTE — Progress Notes (Signed)
   THERAPIST PROGRESS NOTE  Session Time: 1:00-2:00   Participation Level: Active   Behavioral Response: CasualAlertAnxious   Type of Therapy: Individual Therapy   Treatment Goals addressed: Coping   Interventions: CBT, strength-based   Summary: Joe FowlerBryan T Long is a 24 y.o. male who presents with anxiety and depression.   Suicidal/Homicidal: Nowithout intent/plan   Therapist Response: Pt. Continues to presents with euthymic mood, makes appropriate eye contact, talks and laughs appropriately. Pt. Reports that she finished last semester strong with all As and one B. Pt. Reports that he continues to be highly critical of himself and underestimates his intelligence, but this is a pattern with himself and his siblings. Pt. Discussed relational dynamics with twin sister and older brother who he believes is depressed but finds comfort in his children. Pt. Reports that his [place] are planning [place] to [place], which he is happy about because [place] never felt like home for him. Pt. Reports enthusiasm about upcoming semester/ Pt. Reports that his relationship with his girlfriend Geographical information systems officer(Searce) is going well and has made considerable progress working on trust and anger issues in the relationship. Session focused on personal needs for community in transgender relationship community and concerns about his partner's eating disorder. I made referrals to Verlan FriendsSara Young for eating disorders and Kathee DeltonShana Cohn for transgender issues and support group.   Plan: Continue with CBT based therapy. Return again in 2 weeks.   Diagnosis: Axis I: Depressive Disorder NOS   Axis II: No diagnosis   Shaune PollackBrown, Tyreshia Ingman B, [facility] 12/05/2014

## 2014-12-07 ENCOUNTER — Encounter (HOSPITAL_COMMUNITY): Payer: Self-pay | Admitting: Psychiatry

## 2014-12-07 ENCOUNTER — Ambulatory Visit (INDEPENDENT_AMBULATORY_CARE_PROVIDER_SITE_OTHER): Payer: 59 | Admitting: Psychiatry

## 2014-12-07 VITALS — BP 134/80 | HR 85 | Ht 72.0 in | Wt 202.6 lb

## 2014-12-07 DIAGNOSIS — F3181 Bipolar II disorder: Secondary | ICD-10-CM

## 2014-12-07 DIAGNOSIS — F39 Unspecified mood [affective] disorder: Secondary | ICD-10-CM

## 2014-12-07 DIAGNOSIS — F1010 Alcohol abuse, uncomplicated: Secondary | ICD-10-CM

## 2014-12-07 MED ORDER — LAMOTRIGINE 150 MG PO TABS: 150.0000 mg | ORAL_TABLET | Freq: Every day | ORAL | Status: DC
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
## 2014-12-10 ENCOUNTER — Ambulatory Visit (HOSPITAL_COMMUNITY): Payer: Self-pay | Admitting: Psychiatry

## 2014-12-18 ENCOUNTER — Ambulatory Visit (HOSPITAL_COMMUNITY): Payer: Self-pay | Admitting: Psychiatry

## 2014-12-24 ENCOUNTER — Ambulatory Visit (HOSPITAL_COMMUNITY): Payer: Self-pay | Admitting: Psychiatry

## 2014-12-24 ENCOUNTER — Ambulatory Visit (INDEPENDENT_AMBULATORY_CARE_PROVIDER_SITE_OTHER): Payer: 59 | Admitting: Psychiatry

## 2014-12-24 DIAGNOSIS — F3181 Bipolar II disorder: Secondary | ICD-10-CM

## 2014-12-26 NOTE — Progress Notes (Signed)
   THERAPIST PROGRESS NOTE Session Time: 1:05-2:00  Participation Level: Active   Behavioral Response: CasualAlertAnxious   Type of Therapy: Individual Therapy   Treatment Goals addressed: Coping   Interventions: CBT, strength-based   Summary: Joe Long is a 24 y.o. male who presents with anxiety and depression.   Suicidal/Homicidal: Nowithout intent/plan   Therapist Response: Pt. Continues to presents with euthymic mood, some restlessness. Pt. Reports that he has experienced some mild-moderate anxiety. Pt. Reports that his most significant stressors are upper level college courses and coping with his girlfriend's transgender transition. Pt. Continues to report that his girlfriend has an active eating disorder and mood disregulation but has resisted treatment. Pt. Was encouraged to invite her to a joint session to discuss how her behaviors are affecting him and the relationship. Pt.'s general self-care is good. Pt. Reports that he is drinking a lot of coffee which may be affecting his anxiety and sleep. Session focused on processing frustration related to prejudice and intolerance that he observes in society. Discussed acceptance based approach to anger and development of empathy and compassion for others.  Plan: Continue with CBT based therapy. Return again in 2 weeks.   Diagnosis: Axis I: Depressive Disorder NOS   Axis II: No diagnosis    Shaune PollackBrown, Jennifer B, [facility] 12/26/2014

## 2015-01-15 ENCOUNTER — Telehealth (HOSPITAL_COMMUNITY): Payer: Self-pay

## 2015-01-15 MED ORDER — HYDROXYZINE PAMOATE 25 MG PO CAPS: ORAL_CAPSULE | ORAL | Status: DC

## 2015-01-15 NOTE — Telephone Encounter (Signed)
I returned patient's phone call.  He is complaining of increased anxiety and nervousness.  He admitted increased stress but did not provide any details.  He denies any suicidal thoughts.  He is scheduled to see Boneta LucksJennifer Brown this Friday at 2 PM.  In the past he had a good response with Vistaril.  He stopped taking it because he was doing better.  I recommended to restart Vistaril 25 mg 1-2 capsule as needed.  A new prescription will called in to his pharmacy.  Recommended if symptoms does not get better then he should call us back.  He will also need a letter for his school describing his diagnosis.

## 2015-01-18 ENCOUNTER — Ambulatory Visit (INDEPENDENT_AMBULATORY_CARE_PROVIDER_SITE_OTHER): Payer: 59 | Admitting: Psychiatry

## 2015-01-18 DIAGNOSIS — F3181 Bipolar II disorder: Secondary | ICD-10-CM

## 2015-01-21 NOTE — Progress Notes (Signed)
   THERAPIST PROGRESS NOTE  Session Time: 2:05-3:00  Participation Level: Active   Behavioral Response: CasualAlertAnxious   Type of Therapy: Individual Therapy   Treatment Goals addressed: Coping   Interventions: CBT, strength-based   Summary: Joe FowlerBryan T Long is a 24 y.o. male who presents with anxiety and depression.   Suicidal/Homicidal: Nowithout intent/plan   Therapist Response: Pt. Presents with mildly depressed mood, some restlessness. Pt. Reports multiple recent stressors including ending relationship with his girlfriend, recognizing need to withdraw from school for the semester because of feeling emotionally overwhelmed, awareness of need to find a new apartment because chaotic environment created by roommates, and needing to work more hours. Pt. Reports that he has had some fleeting suicidal thoughts in the past two weeks, but that he was able to seek the support that he needed from his sister and a friend. Pt. Reported that he did not believe that he was in danger. Crisis planning was reviewed including options for inpatient and partial hospitalization. Pt. Was instructed to call 911 or the assessment department if he believed that his depression was worsening. Pt. Was observed using breathing technique in session as we would become anxious discussing recent events. Pt. Reported he was prescribed as PRN for anxiety, but was concerned that it felt oversedating and that he would discuss during scheduled appointment with Dr. Lolly MustacheArfeen.   Plan: Continue with CBT based therapy. Return again in 2 weeks.   Diagnosis: Axis I: Depressive Disorder NOS   Axis II: No diagnosis    Shaune PollackBrown, Joe B, [facility] 01/21/2015

## 2015-02-26 ENCOUNTER — Ambulatory Visit (INDEPENDENT_AMBULATORY_CARE_PROVIDER_SITE_OTHER): Payer: 59 | Admitting: Psychiatry

## 2015-02-26 DIAGNOSIS — F3181 Bipolar II disorder: Secondary | ICD-10-CM | POA: Diagnosis not present
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
## 2015-02-28 ENCOUNTER — Ambulatory Visit (INDEPENDENT_AMBULATORY_CARE_PROVIDER_SITE_OTHER): Payer: 59 | Admitting: Psychiatry

## 2015-02-28 ENCOUNTER — Encounter (HOSPITAL_COMMUNITY): Payer: Self-pay | Admitting: Psychiatry

## 2015-02-28 VITALS — BP 123/74 | HR 76 | Ht 72.0 in | Wt 220.6 lb

## 2015-02-28 DIAGNOSIS — F39 Unspecified mood [affective] disorder: Secondary | ICD-10-CM | POA: Diagnosis not present

## 2015-02-28 DIAGNOSIS — F1010 Alcohol abuse, uncomplicated: Secondary | ICD-10-CM

## 2015-02-28 DIAGNOSIS — F3181 Bipolar II disorder: Secondary | ICD-10-CM

## 2015-02-28 MED ORDER — LAMOTRIGINE 200 MG PO TABS: 200.0000 mg | ORAL_TABLET | Freq: Every day | ORAL | Status: DC

## 2015-02-28 MED ORDER — BREXPIPRAZOLE 2 MG PO TABS: 2.0000 mg | ORAL_TABLET | Freq: Every day | ORAL | Status: DC
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
## 2015-03-01 ENCOUNTER — Ambulatory Visit (HOSPITAL_COMMUNITY): Payer: Self-pay | Admitting: Psychiatry

## 2015-03-08 ENCOUNTER — Ambulatory Visit (HOSPITAL_COMMUNITY): Payer: Self-pay | Admitting: Psychiatry

## 2015-03-11 ENCOUNTER — Ambulatory Visit (HOSPITAL_COMMUNITY): Payer: Self-pay | Admitting: Psychiatry

## 2015-03-15 ENCOUNTER — Ambulatory Visit (INDEPENDENT_AMBULATORY_CARE_PROVIDER_SITE_OTHER): Payer: 59 | Admitting: Psychiatry

## 2015-03-15 ENCOUNTER — Telehealth (HOSPITAL_COMMUNITY): Payer: Self-pay | Admitting: *Deleted

## 2015-03-15 DIAGNOSIS — F3181 Bipolar II disorder: Secondary | ICD-10-CM | POA: Diagnosis not present

## 2015-03-15 NOTE — Progress Notes (Signed)
   THERAPIST PROGRESS NOTE  Session Time: 2:00-3:00  Participation Level: Active   Behavioral Response: CasualAlertAnxiousRestless  Type of Therapy: Individual Therapy   Treatment Goals addressed: Coping   Interventions: CBT, strength-based   Summary: Moreen FowlerBryan T Fairhurst is a 24 y.o. male who presents with anxiety and depression.   Suicidal/Homicidal: Nowithout intent/plan   Therapist Response: Pt. Presents with much improved mood compared to previous session. Pt. Reports that he is taking new medication for anxiety and has no side affects and believes that it is helping with anxiety. Pt. Continues to set healthier boundaries in relationship with ex-partner and has discontinued all communication because he understands that he is vulnerable and could easily return to the relationship even though he recognizes that it is not emotionally healthy for him. Pt. Reports that his friends chipped in to purchase a new ferret which made him feel supported by friends. Pt. Reports that he is coping with his anxiety by distraction with his pet and video games and using this time to better understand and care for himself. Pt. 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 Pt. Reports that his parents have moved back to [place] and reports concerns about developing having a better relationship with both of his parents, but recognizes limits to his ability to change his parents. Pt. Discussed awareness of strong sense of responsibility to friend group, ex-partner, siblings, and parents.   Plan: Continue with CBT based therapy. Return again in 2 weeks.   Diagnosis: Axis I: Depressive Disorder NOS   Axis II: No diagnosis   Shaune PollackBrown, Jennifer B, [facility] 03/15/2015

## 2015-03-15 NOTE — Telephone Encounter (Signed)
Dr. Lolly MustacheArfeen,  Patient request call back.  Had a question about medication.  Understands you will be here Tuesday to call back, no emergency per patient.

## 2015-03-19 ENCOUNTER — Telehealth (HOSPITAL_COMMUNITY): Payer: Self-pay | Admitting: Psychiatry

## 2015-03-19 NOTE — Telephone Encounter (Signed)
Call return to left a message to call us back

## 2015-03-20 ENCOUNTER — Telehealth (HOSPITAL_COMMUNITY): Payer: Self-pay

## 2015-03-21 ENCOUNTER — Other Ambulatory Visit (HOSPITAL_COMMUNITY): Payer: Self-pay | Admitting: Psychiatry

## 2015-03-21 DIAGNOSIS — F3181 Bipolar II disorder: Secondary | ICD-10-CM

## 2015-03-22 MED ORDER — BREXPIPRAZOLE 2 MG PO TABS: 2.0000 mg | ORAL_TABLET | Freq: Every day | ORAL | Status: DC

## 2015-03-22 NOTE — Telephone Encounter (Signed)
I returned patient's phone call.  He is feeling better with Rexulti 2 mg and he is reporting improvement in his mood and depression.  He has no side effects.  He need another 2 weeks sample .  He will also need an appointment for the follow-up.

## 2015-03-25 ENCOUNTER — Telehealth (HOSPITAL_COMMUNITY): Payer: Self-pay | Admitting: *Deleted

## 2015-03-25 NOTE — Telephone Encounter (Signed)
Patient picked up RX [id number]

## 2015-04-08 ENCOUNTER — Telehealth (HOSPITAL_COMMUNITY): Payer: Self-pay

## 2015-04-08 DIAGNOSIS — F3181 Bipolar II disorder: Secondary | ICD-10-CM

## 2015-04-08 NOTE — Telephone Encounter (Signed)
Telephone call from patient requesting an order for Rexulti be sent in or called into the [facility] at [address] [place], [place] [phone] as patient reported he had to go there on a "family emergency" and forgot the samples Dr. Lolly MustacheArfeen gave him at last evaluation.  Patient stated he was not sure at this time if he would be back into [facility] by 04/16/15 scheduled next evaluation but stated he would call back and reschedule if necessary.  Patient requested Dr. Lolly MustacheArfeen authorize and send in an order for the Rexulti today as he is currently out.

## 2015-04-08 NOTE — Telephone Encounter (Signed)
Please call refill his meds

## 2015-04-09 MED ORDER — BREXPIPRAZOLE 2 MG PO TABS: 2.0000 mg | ORAL_TABLET | Freq: Every day | ORAL | Status: DC

## 2015-04-09 NOTE — Telephone Encounter (Signed)
Called in new order for patient's Rexulti 2mg , #30, one per day to [facility] [place], [place] with Amy Bibliuibuicc, pharmacist.  Left patient a message the medication was called into the pharmacy he requested and to call them to make sure the medicine was in as they stated they may have to order and it may take another day to get into their store.  Requested patient call back if any more questions or needed assistance.

## 2015-04-10 ENCOUNTER — Telehealth (HOSPITAL_COMMUNITY): Payer: Self-pay | Admitting: *Deleted

## 2015-04-10 NOTE — Telephone Encounter (Signed)
Went online to [employer].  Filled out form for [employer].  Key is [id number]. Prior Authorization submitted.  Awaiting response.

## 2015-04-15 ENCOUNTER — Telehealth (HOSPITAL_COMMUNITY): Payer: Self-pay | Admitting: *Deleted

## 2015-04-15 NOTE — Telephone Encounter (Signed)
Rexulti was denied. Pt has only tried and failed 1 SSRI. Must also try a SNRI or mirtazapine, or bupropion and at least 1 of the following atypical antipsychotics for the adjunctive treatment of MDD with an antidepressant: Olanzapine, aripiprazole, or Seroquel XR.

## 2015-04-16 ENCOUNTER — Ambulatory Visit (HOSPITAL_COMMUNITY): Payer: Self-pay | Admitting: Psychiatry

## 2015-04-17 NOTE — Telephone Encounter (Signed)
Patients Rexulti was denied by his insurance.  Patient can try Abilify 2 mg once daily per Dr. Lolly MustacheArfeen.  LMOM for patient to return call to the office.

## 2015-04-19 ENCOUNTER — Telehealth (HOSPITAL_COMMUNITY): Payer: Self-pay

## 2015-04-19 DIAGNOSIS — F3181 Bipolar II disorder: Secondary | ICD-10-CM

## 2015-04-19 MED ORDER — ARIPIPRAZOLE 2 MG PO TABS
2.0000 mg | ORAL_TABLET | Freq: Every day | ORAL | Status: DC
Start: 2015-04-19 — End: 2015-08-23

## 2015-04-19 NOTE — Telephone Encounter (Signed)
Called patient back and left a message for him after he left a message stating he did want to try Abilify medication since his past prescribed Rexulti would not be covered by insurance. Informed patient on message he would be getting Abilify 2mg , one daily per Dr. Lolly MustacheArfeen but this nurse needed to know which pharmacy to call in the new order as patient had been in [place].  Requested patient call back with this information so his new prescription could be ordered.

## 2015-04-19 NOTE — Telephone Encounter (Signed)
Patient called back and stated he wanted to try Abilify 2mg , one a day, #30 with no refills.  Agreed [facility] [place] [place].  Requested patient make an appointment with Dr. Lolly MustacheArfeen for follow up as patient stated he was not sure when coming back.  Stated would be in town the week of 04/29/15 but no openings in Dr. Sheela StackArfeen's schedule that week.  Patient will take medication and call back in one week to let us know how it is working.  Informed of potential side effects and how Dr. Lolly MustacheArfeen wanted patient taking.  Requested patient call back to schedule an appointment for ASAP once knows when returning and patient agreed.  Stated would be the latest in August so informed him Dr. Lolly MustacheArfeen would have to decide if would continue new medication without seeing him and patient stated understanding.  [facility] [place].

## 2015-05-07 ENCOUNTER — Telehealth (HOSPITAL_COMMUNITY): Payer: Self-pay | Admitting: Psychiatry

## 2015-05-07 ENCOUNTER — Emergency Department (HOSPITAL_COMMUNITY)
Admission: EM | Admit: 2015-05-07 | Discharge: 2015-05-08 | Discharge status: Against Medical Advice | Payer: 59 | Attending: Emergency Medicine | Admitting: Emergency Medicine

## 2015-05-07 ENCOUNTER — Encounter (HOSPITAL_COMMUNITY): Payer: Self-pay | Admitting: Emergency Medicine

## 2015-05-07 DIAGNOSIS — J45909 Unspecified asthma, uncomplicated: Secondary | ICD-10-CM | POA: Insufficient documentation

## 2015-05-07 DIAGNOSIS — T43595A Adverse effect of other antipsychotics and neuroleptics, initial encounter: Secondary | ICD-10-CM | POA: Diagnosis not present

## 2015-05-07 DIAGNOSIS — R531 Weakness: Secondary | ICD-10-CM | POA: Diagnosis not present

## 2015-05-07 DIAGNOSIS — G4700 Insomnia, unspecified: Secondary | ICD-10-CM | POA: Insufficient documentation

## 2015-05-07 DIAGNOSIS — R251 Tremor, unspecified: Secondary | ICD-10-CM | POA: Diagnosis not present

## 2015-05-07 DIAGNOSIS — Z87891 Personal history of nicotine dependence: Secondary | ICD-10-CM | POA: Insufficient documentation

## 2015-05-07 HISTORY — DX: Bipolar II disorder: F31.81

## 2015-05-07 NOTE — ED Notes (Signed)
Pt c/o "side effects of medication". States that he was put on abilify about a week ago.  States that he has been having tremors, intermittent chest pain (denies now), weakness, insomnia since starting the medication.  When asked if pt has called his doctor, pt states no and states that he decided to come to the ER "in case it was serious".

## 2015-05-07 NOTE — Telephone Encounter (Signed)
I returned patient's phone call.  He is complaining of tremors, feeling tired, insomnia and some time chest pain.  He believed that Abilify is causing the side effects.  He wants to come off from Abilify.  I recommended to stop the Abilify and see if the side effects go away.  If symptoms still persist and he need to see medical doctor for further workup.  Patient will call us back after a week.  He denies any suicidal thoughts or homicidal thought.  He is taking his other medication as prescribed.

## 2015-05-09 ENCOUNTER — Ambulatory Visit (HOSPITAL_COMMUNITY): Payer: Self-pay | Admitting: Psychiatry

## 2015-05-13 ENCOUNTER — Other Ambulatory Visit (HOSPITAL_COMMUNITY): Payer: Self-pay | Admitting: Psychiatry

## 2015-05-23 ENCOUNTER — Ambulatory Visit (HOSPITAL_COMMUNITY): Payer: Self-pay | Admitting: Psychiatry

## 2015-06-06 ENCOUNTER — Ambulatory Visit (HOSPITAL_COMMUNITY): Payer: Self-pay | Admitting: Psychiatry

## 2015-06-20 ENCOUNTER — Ambulatory Visit (HOSPITAL_COMMUNITY): Payer: Self-pay | Admitting: Psychiatry

## 2015-06-21 ENCOUNTER — Other Ambulatory Visit (HOSPITAL_COMMUNITY): Payer: Self-pay | Admitting: Psychiatry

## 2015-06-21 IMAGING — CT CT ABD-PELV W/ CM
1 of 2 series · 16 of 32 positions shown, 20 images · IV contrast (OMNIPAQUE 300)
Comparison: None.

CLINICAL DATA: Left flank pain

EXAM:
CT ABDOMEN AND PELVIS WITH CONTRAST
TECHNIQUE: Multidetector CT imaging of the abdomen and pelvis was performed
using the standard protocol following bolus administration of
intravenous contrast.
CONTRAST:  100 mL Omnipaque 300

[Series 2: abd/pel with · axial · 0.84mm/px · z∈[+1168,+1598]mm · 16 of 94 slices shown, 20 images]
[im 4/94  soft-tissue]
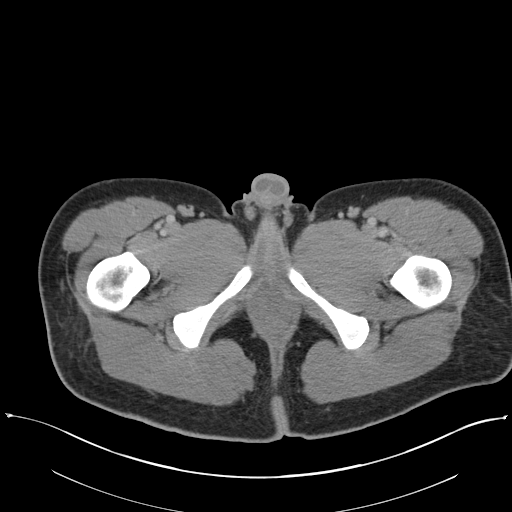
[im 4/94  bone]
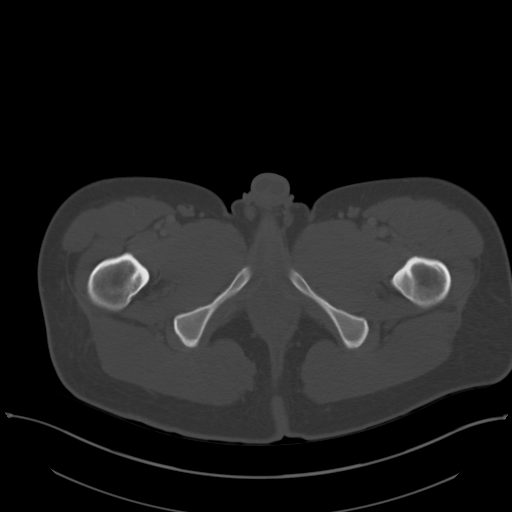
[im 12/94  soft-tissue]
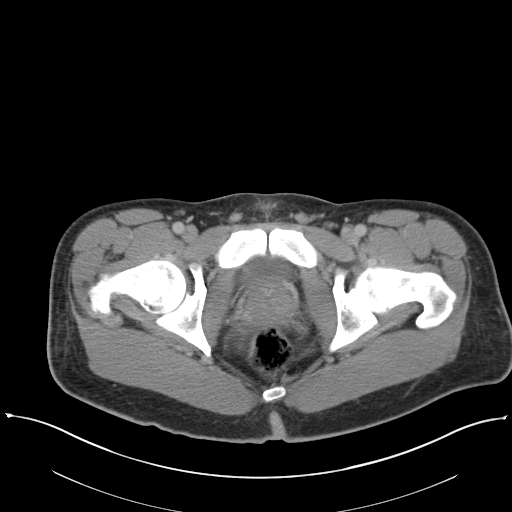
[im 20/94  soft-tissue]
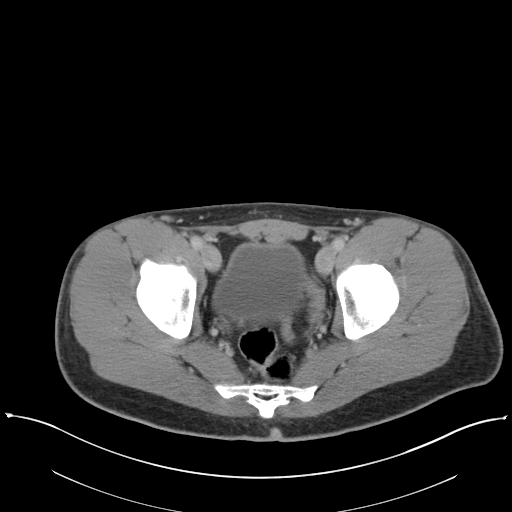
[im 24/94  soft-tissue]
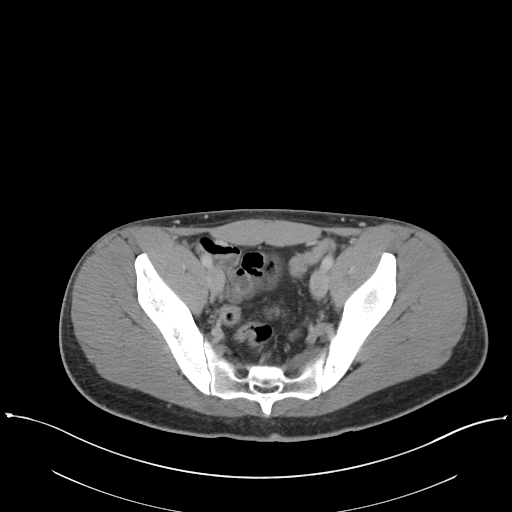
[im 32/94  soft-tissue]
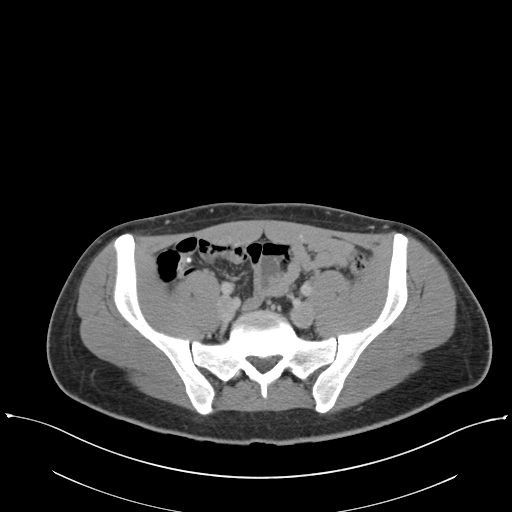
[im 39/94  soft-tissue]
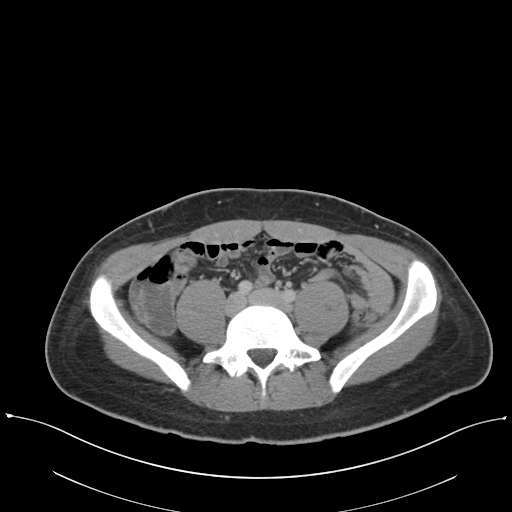
[im 43/94  soft-tissue]
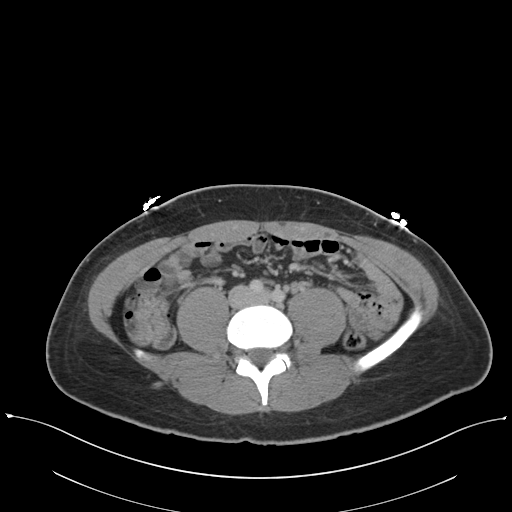
[im 51/94  soft-tissue]
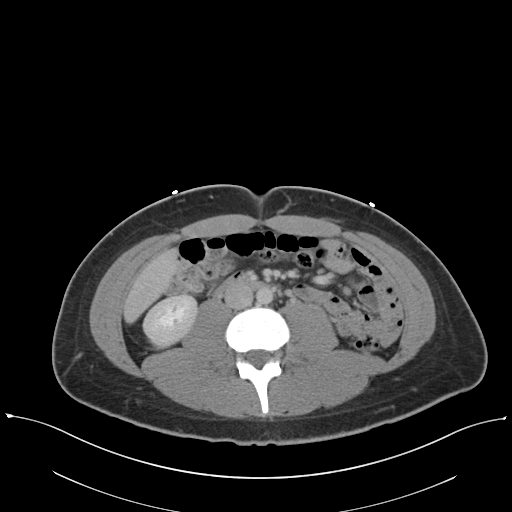
[im 55/94  soft-tissue]
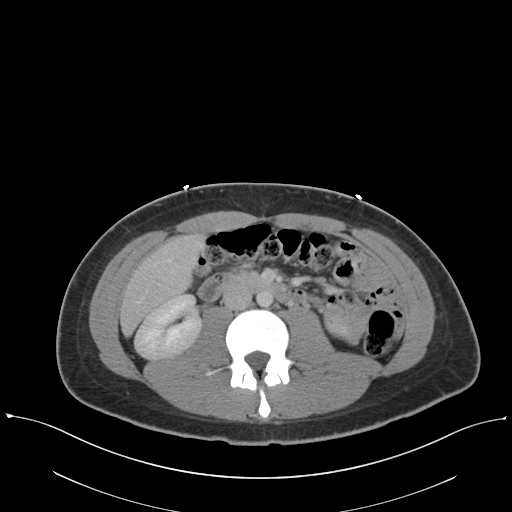
[im 55/94  bone]
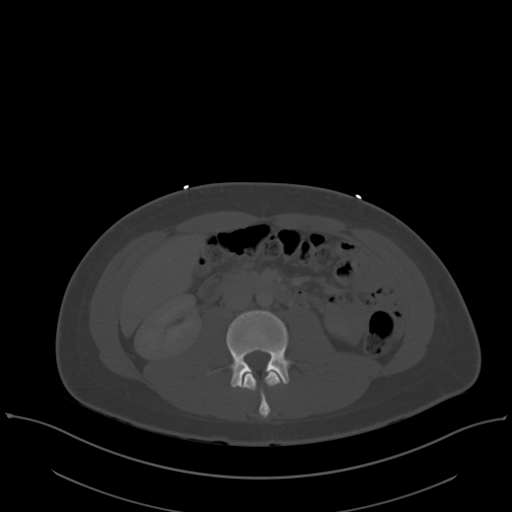
[im 63/94  soft-tissue]
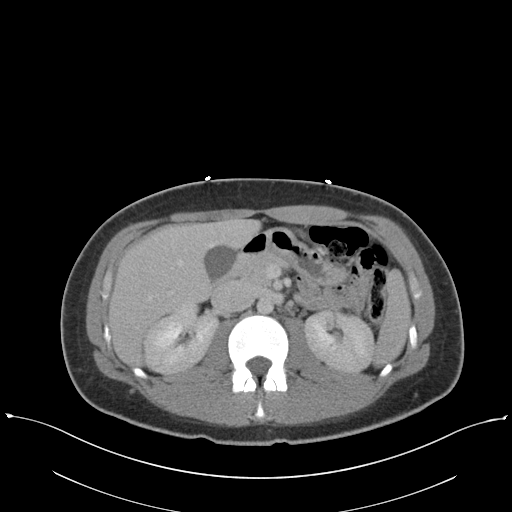
[im 70/94  soft-tissue]
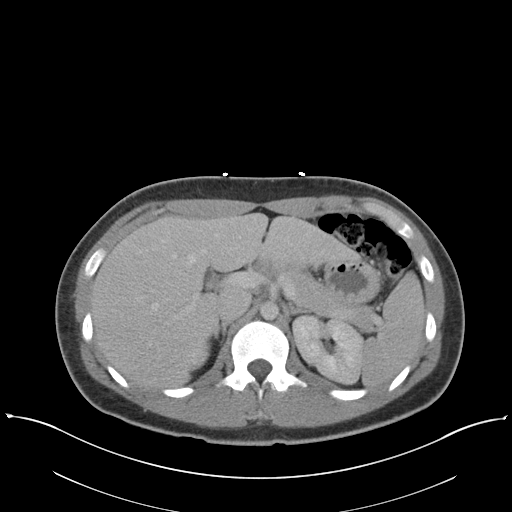
[im 74/94  soft-tissue]
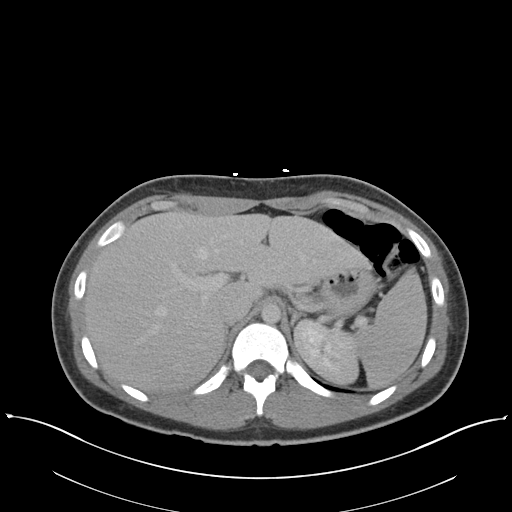
[im 78/94  lung]
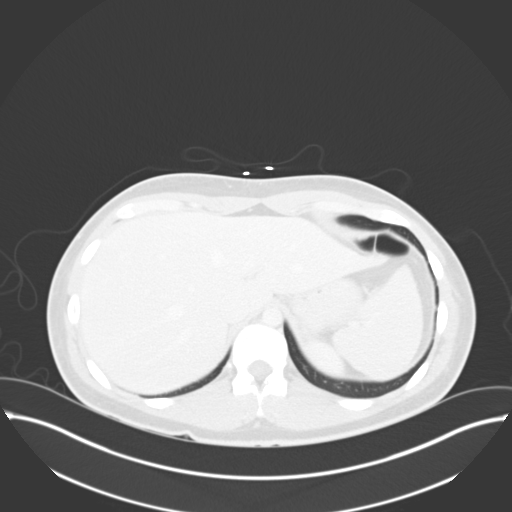
[im 82/94  soft-tissue]
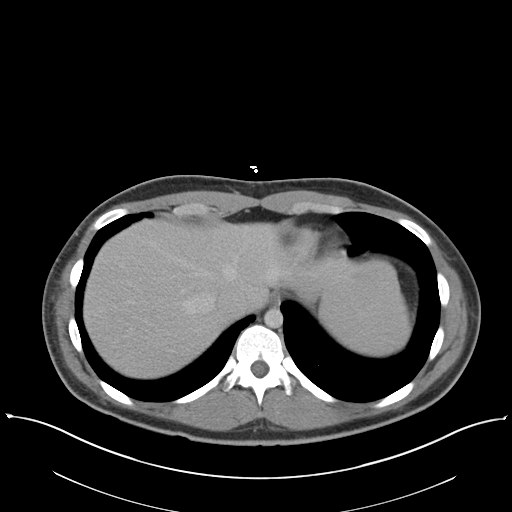
[im 82/94  lung]
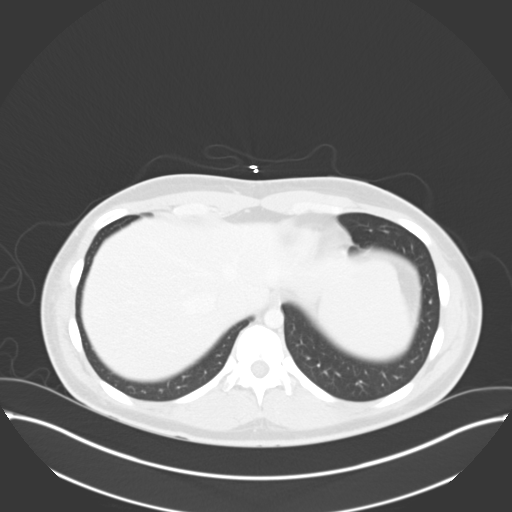
[im 86/94  lung]
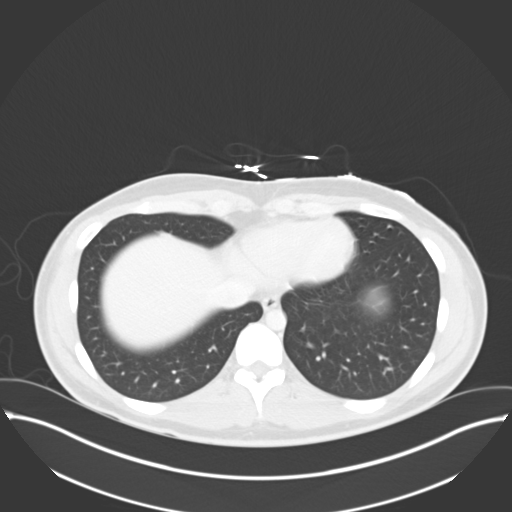
[im 90/94  soft-tissue]
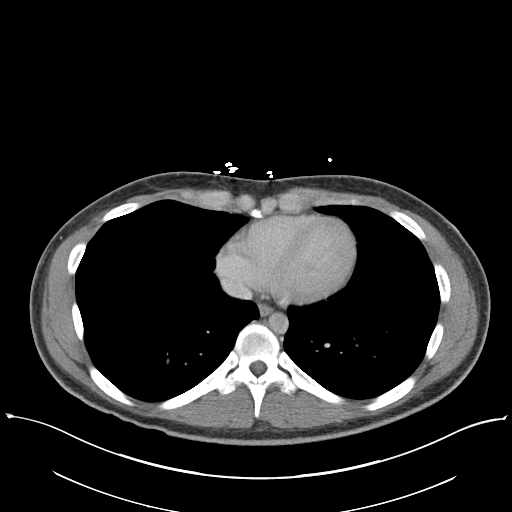
[im 90/94  lung]
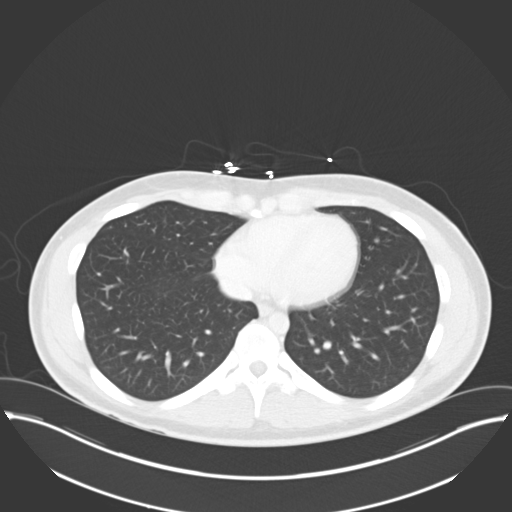

[16 of 32 positions shown; findings below may reference images not displayed]

FINDINGS: The liver, spleen, pancreas, gallbladder, adrenal glands and kidneys
are normal. There is no CT evidence of left pyelonephritis. The
aorta is normal. There is no abdominal lymphadenopathy. There is no
small bowel obstruction or diverticulitis. There is no evidence of
appendicitis.

Fluid-filled bladder is normal. The lung bases are clear. No acute
abnormalities identified within the visualized bones.
IMPRESSION: No acute abnormality identified in the abdomen and pelvis. No CT
evidence of pyelonephritis.

## 2015-06-27 ENCOUNTER — Encounter (HOSPITAL_COMMUNITY): Payer: Self-pay | Admitting: Psychiatry

## 2015-06-27 ENCOUNTER — Telehealth (HOSPITAL_COMMUNITY): Payer: Self-pay

## 2015-06-27 DIAGNOSIS — F3181 Bipolar II disorder: Secondary | ICD-10-CM

## 2015-06-27 NOTE — Telephone Encounter (Signed)
Medication refill request  - states he has been on Lamictal  up until 3 days ago and would like enough until he returns from [place] and sees Dr. Lolly Mustache 07/29/15 for first available scheduled today. Patient reports he when he last got a refill on 02/28/15 and was to return in 2 weeks, he had an old bottle of  Lamictal and between old medication and the  bottle he had, he states he just ran out 3 days prior and states he was taking  each day.  Discussed patient's missed appointments and time it has been since he has been seen but patient still requests a refill at  a day until he can see Dr. Lolly Mustache now set for 07/29/15.  Patient returns from [place] on 07/10/15 and the first available appointment with Dr. Lolly Mustache after that is for 07/29/15.  Patient turned down offered time on 07/22/15 as states that is the first day of school and could not miss it.  Informed this nurse was not sure Dr. Lolly Mustache would feel comfortable filling Lamictal due to so long since being seen but would request.  Patient reported he is not taking Abilify and only wants Lamictal  Encouraged patient to keep appointment on 07/29/15 and will follow up with patient once Dr. Lolly Mustache reviews request. Last seen 02/28/15 with plan to return in 2 weeks.  Patient canceled 03/08/15 and then no showed on 03/11/15.  Patient then moved to [place] for the summer and will return on 07/10/15.  [facility] [place] [place].

## 2015-06-28 MED ORDER — LAMOTRIGINE 200 MG PO TABS: 200.0000 mg | ORAL_TABLET | Freq: Every day | ORAL | Status: DC

## 2015-06-28 NOTE — Telephone Encounter (Signed)
One time Lamictal refill approved by Dr. Lolly Mustache and [facility] [place] [place] as authorized.  Let patient a message the one time order was e-scribed in and encouraged patient to keep his appointment on 07/29/15 upon his return to [place] for further refills.

## 2015-07-04 ENCOUNTER — Ambulatory Visit (HOSPITAL_COMMUNITY): Payer: Self-pay | Admitting: Psychiatry

## 2015-07-05 ENCOUNTER — Telehealth (HOSPITAL_COMMUNITY): Payer: Self-pay | Admitting: *Deleted

## 2015-07-05 NOTE — Telephone Encounter (Signed)
Patient called left a message stating that he needs Dr. Lolly Mustache to write a letter for his school describing what is being done to manage his condition. This will help with his financial aid.

## 2015-07-10 NOTE — Telephone Encounter (Signed)
Letter prepared for patient's college per patient request.  Left a message patient's letter was prepared for pick up and patient's review.  Informed patient on message if any concerns with letter upon him picking it up to please let us know.

## 2015-07-18 ENCOUNTER — Ambulatory Visit (INDEPENDENT_AMBULATORY_CARE_PROVIDER_SITE_OTHER): Payer: 59 | Admitting: Psychiatry

## 2015-07-18 DIAGNOSIS — F3181 Bipolar II disorder: Secondary | ICD-10-CM | POA: Diagnosis not present

## 2015-07-18 NOTE — Progress Notes (Signed)
   THERAPIST PROGRESS NOTE  Session Time: 2:05-2:55  Participation Level: Active   Behavioral Response: CasualAlertEuthymic  Type of Therapy: Individual Therapy   Treatment Goals addressed: Coping   Interventions: CBT, strength-based   Summary: GRYFFIN ALTICE is a 24 y.o. male who presents with anxiety and depression.   Suicidal/Homicidal: Nowithout intent/plan   Therapist Response: Pt. Presents for first session since April 2016. Pt. Presents with euthymic mood, much improved compared to previous session. Pt. Presents as talkative and makes good eye contact. Pt. Spent the summer with his family in [place]. Pt. Reports that his parents and brother have moved to [place]. Pt. Reports needing the support of his family, but has accepted that he is on "his own now." Pt. Has changed name to [facility] Griffie[place]Cucalo. Pt. Made the decision to change his name in order to more fully embrace his [place] heritage. Pt. Expressed excitement about re-enrolling in [employer] for the fall semester and is expecting to graduate in Fall 2017. Pt. Reports that he has set up more work hours and applied for foodstamps. Pt. Reports that he feels emotionally strong and ready to start the semester. Pt. Was given a letter from this writer indicating dates of service and approximate date of Pt.'s mental health crisis in the spring to help him with appeal to have financial aid re-instated.   Plan: Continue with CBT based therapy. Return again in 2 weeks.   Diagnosis: Axis I: Depressive Disorder NOS   Axis II: No diagnosis   [name], [facility] 07/18/2015

## 2015-07-22 ENCOUNTER — Ambulatory Visit (HOSPITAL_COMMUNITY): Payer: Self-pay | Admitting: Psychiatry

## 2015-07-29 ENCOUNTER — Ambulatory Visit (HOSPITAL_COMMUNITY): Payer: Self-pay | Admitting: Psychiatry

## 2015-08-23 ENCOUNTER — Encounter (HOSPITAL_COMMUNITY): Payer: Self-pay | Admitting: Psychiatry

## 2015-08-23 ENCOUNTER — Ambulatory Visit (INDEPENDENT_AMBULATORY_CARE_PROVIDER_SITE_OTHER): Payer: 59 | Admitting: Psychiatry

## 2015-08-23 VITALS — BP 138/86 | HR 79 | Ht 72.0 in | Wt 253.8 lb

## 2015-08-23 DIAGNOSIS — F1010 Alcohol abuse, uncomplicated: Secondary | ICD-10-CM

## 2015-08-23 DIAGNOSIS — F39 Unspecified mood [affective] disorder: Secondary | ICD-10-CM | POA: Diagnosis not present

## 2015-08-23 DIAGNOSIS — F3181 Bipolar II disorder: Secondary | ICD-10-CM

## 2015-08-23 MED ORDER — LAMOTRIGINE 200 MG PO TABS: 200.0000 mg | ORAL_TABLET | Freq: Every day | ORAL | Status: AC
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
## 2015-09-05 ENCOUNTER — Ambulatory Visit (HOSPITAL_COMMUNITY): Payer: Self-pay | Admitting: Psychiatry

## 2015-09-12 ENCOUNTER — Ambulatory Visit (INDEPENDENT_AMBULATORY_CARE_PROVIDER_SITE_OTHER): Payer: 59 | Admitting: Psychiatry

## 2015-09-12 DIAGNOSIS — F3181 Bipolar II disorder: Secondary | ICD-10-CM
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
## 2015-09-17 ENCOUNTER — Ambulatory Visit (INDEPENDENT_AMBULATORY_CARE_PROVIDER_SITE_OTHER): Payer: 59 | Admitting: Psychiatry

## 2015-09-17 DIAGNOSIS — F3181 Bipolar II disorder: Secondary | ICD-10-CM | POA: Diagnosis not present

## 2015-09-20 NOTE — Progress Notes (Signed)
   THERAPIST PROGRESS NOTE  Session Time: 1:05-2:00  Participation Level: Active   Behavioral Response: CasualAlertEuthymic  Type of Therapy: Individual Therapy   Treatment Goals addressed: Coping   Interventions: CBT, strength-based   Summary: Joe FowlerBryan T Long is a 24 y.o. male who presents with anxiety and depression.   Suicidal/Homicidal: Nowithout intent/plan   Therapist Response: Pt. Continues to present with euthymic mood, talkative, and engaged int he therapeutic process. Pt. Reports that he was able to reduce his stress load by changing his job schedule so that he now has two days off a week. The change in schedule has made his school/study schedule more manageable. Pt. Reports that he has some anxiety regarding his grammar accuracy. Pt. [facility] for feedback on her grammar. Pt. Continues to report anger towards his parents for abandoning him and his siblings and not being responsible regarding the home before moving to [place]. Pt. Reports that his primary concern is securing housing and finding a place to store his personal belongings once the house is sold. Pt. Was encouraged to be proactive regarding his housing concerns by contacting student housing and researching rooming houses near campus. Pt. Reports that his relationships with his girlfriend and twin sister continue to be significant sources of support. Pt. Reports that his sister has been able to model for him healthy boundaries with his parents.   Plan: Continue with CBT based therapy. Return again in 2 weeks.   Diagnosis: Axis I: Depressive Disorder NOS   Axis II: No diagnosis   Shaune PollackBrown, Navya Timmons B, [facility] 09/20/2015

## 2015-09-26 ENCOUNTER — Ambulatory Visit (INDEPENDENT_AMBULATORY_CARE_PROVIDER_SITE_OTHER): Payer: 59 | Admitting: Psychiatry

## 2015-09-26 DIAGNOSIS — F319 Bipolar disorder, unspecified: Secondary | ICD-10-CM

## 2015-09-26 DIAGNOSIS — F3181 Bipolar II disorder: Secondary | ICD-10-CM

## 2015-09-27 NOTE — Progress Notes (Signed)
   THERAPIST PROGRESS NOTE  Session Time: 2:05-3:00  Participation Level: Active   Behavioral Response: CasualAlertEuthymic  Type of Therapy: Individual Therapy   Treatment Goals addressed: Coping   Interventions: CBT, strength-based   Summary: Joe FowlerBryan T Long is a 24 y.o. male who presents with anxiety and depression.   Suicidal/Homicidal: Nowithout intent/plan   Therapist Response: Pt. Continues to present with euthymic mood, talkative, and engaged int he therapeutic process. Pt. Discussed anxiety and disappointment with himself related to turning in school assignments later. Pt. Explored patterns of perfectionism, strong work [employer]. Pt. Discussed impressions of himself as irresponsible which triggers negative thoughts about himself when he turns in assignments late. Pt. 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 Pt. Reported that he has been given a smart pen that helps him with notetaking, but continues to have difficulty with getting to class sometimes because of his anxiety. Pt. Participated in the "people I admire exercise". Pt. Identified the character traits "strong under intense pressure", "open" and "giving". Pt. Was able to recognize these are core personal attributes. Pt. Also recognized that his core traits are challenged when he turns in a paper late. He was encouraged to engage in positive affirmations to counter his negative thoughts about himself.   Plan: Continue with CBT based therapy. Return again in 2 weeks.   Diagnosis: Axis I: Depressive Disorder NOS   Axis II: No diagnosis   Shaune PollackBrown, Jennifer B, [facility] 09/27/2015

## 2015-10-23 ENCOUNTER — Ambulatory Visit (HOSPITAL_COMMUNITY): Payer: Self-pay | Admitting: Psychiatry

## 2015-10-31 ENCOUNTER — Ambulatory Visit (HOSPITAL_COMMUNITY): Payer: Self-pay | Admitting: Psychiatry

## 2015-12-11 ENCOUNTER — Ambulatory Visit (HOSPITAL_COMMUNITY): Payer: Self-pay | Admitting: Psychiatry
